# Patient Record
Sex: Male | Born: 1990 | Race: Black or African American | Hispanic: No | Marital: Single | Smoking: Current every day smoker
Health system: Southern US, Community
[De-identification: ages and names within clinical notes are randomized; demographics above are authoritative.]

## PROBLEM LIST (undated history)

## (undated) DIAGNOSIS — M419 Scoliosis, unspecified: Secondary | ICD-10-CM

---
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
## 2012-08-29 ENCOUNTER — Encounter (HOSPITAL_COMMUNITY): Payer: Self-pay | Admitting: Emergency Medicine

## 2012-08-29 ENCOUNTER — Emergency Department (HOSPITAL_COMMUNITY)
Admission: EM | Admit: 2012-08-29 | Discharge: 2012-08-29 | Disposition: A | Discharge status: Home / Self Care | Payer: Medicaid Other | Attending: Emergency Medicine | Admitting: Emergency Medicine

## 2012-08-29 DIAGNOSIS — R112 Nausea with vomiting, unspecified: Secondary | ICD-10-CM | POA: Insufficient documentation

## 2012-08-29 DIAGNOSIS — R197 Diarrhea, unspecified: Secondary | ICD-10-CM | POA: Insufficient documentation

## 2012-08-29 DIAGNOSIS — R110 Nausea: Secondary | ICD-10-CM

## 2012-08-29 DIAGNOSIS — R109 Unspecified abdominal pain: Secondary | ICD-10-CM

## 2012-08-29 LAB — POCT I-STAT, CHEM 8
HCT: 48 % (ref 39.0–52.0)
Hemoglobin: 16.3 g/dL (ref 13.0–17.0)
Potassium: 4 mEq/L (ref 3.5–5.1)
Sodium: 140 mEq/L (ref 135–145)
TCO2: 26 mmol/L (ref 0–100)

## 2012-08-29 LAB — CBC WITH DIFFERENTIAL/PLATELET
Basophils Absolute: 0 10*3/uL (ref 0.0–0.1)
Basophils Relative: 1 % (ref 0–1)
Eosinophils Absolute: 0.2 10*3/uL (ref 0.0–0.7)
MCH: 30.6 pg (ref 26.0–34.0)
MCHC: 34.6 g/dL (ref 30.0–36.0)
Neutrophils Relative %: 39 % — ABNORMAL LOW (ref 43–77)
Platelets: 187 10*3/uL (ref 150–400)
RBC: 4.93 MIL/uL (ref 4.22–5.81)
RDW: 12.1 % (ref 11.5–15.5)

## 2012-08-29 LAB — HEPATIC FUNCTION PANEL
Albumin: 3.9 g/dL (ref 3.5–5.2)
Indirect Bilirubin: 0.4 mg/dL (ref 0.3–0.9)
Total Protein: 6.9 g/dL (ref 6.0–8.3)

## 2012-08-29 MED ORDER — ONDANSETRON 4 MG PO TBDP: 4.0000 mg | ORAL_TABLET | Freq: Three times a day (TID) | ORAL | Status: DC | PRN

## 2012-08-29 MED ORDER — SODIUM CHLORIDE 0.9 % IV BOLUS (SEPSIS)
1000.0000 mL | Freq: Once | INTRAVENOUS | Status: AC
  Administered 2012-08-29: 1000 mL via INTRAVENOUS

## 2012-08-29 MED ORDER — ONDANSETRON HCL 4 MG/2ML IJ SOLN
4.0000 mg | Freq: Once | INTRAMUSCULAR | Status: AC
  Administered 2012-08-29: 4 mg via INTRAVENOUS
  Filled 2012-08-29: qty 2

## 2012-08-29 NOTE — ED Notes (Signed)
Pt given water and crackers, reports no nausea or vomiting.

## 2012-08-29 NOTE — ED Notes (Signed)
Pt c/o mid abdominal pain with n/v/d x 3-4 days. Pt reports symptoms began with abdominal pain with gas 3-4 days ago, then had diarrhea yesterday and vomiting today.

## 2012-08-29 NOTE — ED Provider Notes (Signed)
History     CSN: [id number]  Arrival date & time 08/29/12  2841   First MD Initiated Contact with Patient 08/29/12 1142      Chief Complaint  Patient presents with  . Abdominal Pain    (Consider location/radiation/quality/duration/timing/severity/associated sxs/prior treatment) HPI Comments: Pt presents to the ED for abdominal pain x 3 days.  Describes pain it as peri-umbilical, non-radiating, cramping sensation.  Episode of diarrhea last night and N/V this morning while at work.  Last normal BM 3 days ago.  Has been around family members with similar sx.  Has been passing foul smelling gas regularly.  Dietary changes noted- has been eating more fast food lately due to work schedule.  No significant PMH.  No recent EtOH.  Denies any CP, SOB, dysuria, fever, chills.  Patient is a 22 y.o. male presenting with abdominal pain. The history is provided by the patient.  Abdominal Pain   History reviewed. No pertinent past medical history.  History reviewed. No pertinent past surgical history.  No family history on file.  History  Substance Use Topics  . Smoking status: Never Smoker   . Smokeless tobacco: Not on file  . Alcohol Use: No      Review of Systems  Gastrointestinal: Positive for abdominal pain.  All other systems reviewed and are negative.    Allergies  Review of patient's allergies indicates no known allergies.  Home Medications  No current outpatient prescriptions on file.  BP 129/85  Pulse 79  Temp(Src) 98.6 F (37 C) (Oral)  Resp 19  SpO2 99%  Physical Exam  Nursing note and vitals reviewed. Constitutional: He is oriented to person, place, and time. Vital signs are normal. He appears well-developed and well-nourished.  HENT:  Head: Normocephalic and atraumatic.  Mouth/Throat: Oropharynx is clear and moist and mucous membranes are normal. No oropharyngeal exudate, posterior oropharyngeal erythema or tonsillar abscesses.  Eyes: Conjunctivae and EOM are  normal. Pupils are equal, round, and reactive to light.  Neck: Normal range of motion.  Cardiovascular: Normal rate, regular rhythm and normal heart sounds.   Pulmonary/Chest: Effort normal and breath sounds normal.  Abdominal: Soft. Bowel sounds are normal. He exhibits no distension. There is no tenderness. There is no CVA tenderness, no tenderness at McBurney's point and negative Murphy's sign.  Discomfort in periumbilical and epigastric regions but no true tenderness  Musculoskeletal: Normal range of motion.  Neurological: He is alert and oriented to person, place, and time. He has normal strength.  CN grossly intact  Skin: Skin is warm and dry.  Psychiatric: He has a normal mood and affect.    ED Course  Procedures (including critical care time)  Labs Reviewed  CBC WITH DIFFERENTIAL - Abnormal; Notable for the following:    WBC 3.1 (*)    Neutrophils Relative 39 (*)    Neutro Abs 1.2 (*)    Monocytes Relative 15 (*)    All other components within normal limits  LIPASE, BLOOD  HEPATIC FUNCTION PANEL  POCT I-STAT, CHEM 8   No results found.   1. Abdominal pain   2. Nausea       MDM    22 y.o. Male presenting to the ED with periumbilical abdominal pain x 3 days.  One episode of diarrhea yesterday and few episodes of vomiting this morning.  Labs largely unremarkable with the exception of above.  No active vomiting while in the ED.  Symptoms improved greatly with IV fluids and zofran.  Low suspicion  for acute pathology.  Able to tolerate PO food and drink prior to d/c.  Rx zofran given for at home use.  Return to the ED for new or worsening symptoms.      Garlon Hatchet, PA-C 08/29/12 1442

## 2012-09-01 NOTE — ED Provider Notes (Signed)
Medical screening examination/treatment/procedure(s) were performed by non-physician practitioner and as supervising physician I was immediately available for consultation/collaboration.   Laray Anger, DO 09/01/12 1836

## 2012-09-30 ENCOUNTER — Encounter (HOSPITAL_COMMUNITY): Payer: Self-pay

## 2012-09-30 ENCOUNTER — Emergency Department (INDEPENDENT_AMBULATORY_CARE_PROVIDER_SITE_OTHER)
Admission: EM | Admit: 2012-09-30 | Discharge: 2012-09-30 | Disposition: A | Discharge status: Home / Self Care | Payer: Medicaid Other | Source: Home / Self Care | Attending: Emergency Medicine | Admitting: Emergency Medicine

## 2012-09-30 DIAGNOSIS — B001 Herpesviral vesicular dermatitis: Secondary | ICD-10-CM

## 2012-09-30 DIAGNOSIS — B009 Herpesviral infection, unspecified: Secondary | ICD-10-CM

## 2012-09-30 MED ORDER — ACYCLOVIR 400 MG PO TABS: 400.0000 mg | ORAL_TABLET | Freq: Three times a day (TID) | ORAL | Status: AC

## 2012-09-30 NOTE — ED Provider Notes (Signed)
History     CSN: [id number]  Arrival date & time 09/30/12  1113   First MD Initiated Contact with Patient 09/30/12 1128      Chief Complaint  Patient presents with  . Oral Swelling    (Consider location/radiation/quality/duration/timing/severity/associated sxs/prior treatment) HPI Comments: Patient presents urgent care this afternoon describing for the last 2 days he has developed some sores on his upper lip that are tender and at times makes him experience a burning sensation. He has not had the sores before denies any further symptoms such as fevers, malaise, headaches, or palpable lymph nodes. Patient denies having had a history of herpes of the Allis or any other STDs.  The history is provided by the patient.    History reviewed. No pertinent past medical history.  History reviewed. No pertinent past surgical history.  History reviewed. No pertinent family history.  History  Substance Use Topics  . Smoking status: Never Smoker   . Smokeless tobacco: Not on file  . Alcohol Use: No      Review of Systems  Constitutional: Negative for fever, chills, activity change and appetite change.  Skin: Positive for rash.  Neurological: Negative for dizziness, weakness, numbness and headaches.    Allergies  Review of patient's allergies indicates no known allergies.  Home Medications   Current Outpatient Rx  Name  Route  Sig  Dispense  Refill  . acyclovir (ZOVIRAX) 400 MG tablet   Oral   Take 1 tablet (400 mg total) by mouth 3 (three) times daily.   15 tablet   1   . ondansetron (ZOFRAN ODT) 4 MG disintegrating tablet   Oral   Take 1 tablet (4 mg total) by mouth every 8 (eight) hours as needed for nausea.   10 tablet   0     BP 125/77  Pulse 78  Temp(Src) 98.2 F (36.8 C) (Oral)  Resp 16  SpO2 96%  Physical Exam  Vitals reviewed. Constitutional: He is oriented to person, place, and time. Vital signs are normal. He appears well-developed and well-nourished.   Non-toxic appearance. He does not have a sickly appearance. He does not appear ill. No distress.  HENT:  Mouth/Throat:    Neurological: He is alert and oriented to person, place, and time.  Skin: No rash noted. There is erythema.    ED Course  Procedures (including critical care time)  Labs Reviewed - No data to display No results found.   1. 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 Patient has been prescribed a sickly her and provide her with a refill for a future recurrence he.        Jimmie Molly, MD 09/30/12 1245

## 2012-09-30 NOTE — ED Notes (Signed)
Lips swollen for 2 days

## 2013-05-25 ENCOUNTER — Encounter (HOSPITAL_COMMUNITY): Payer: Self-pay | Admitting: Emergency Medicine

## 2013-05-25 ENCOUNTER — Emergency Department (HOSPITAL_COMMUNITY)
Admission: EM | Admit: 2013-05-25 | Discharge: 2013-05-25 | Disposition: A | Discharge status: Home / Self Care | Payer: Medicaid Other | Attending: Emergency Medicine | Admitting: Emergency Medicine

## 2013-05-25 DIAGNOSIS — R300 Dysuria: Secondary | ICD-10-CM | POA: Insufficient documentation

## 2013-05-25 DIAGNOSIS — R369 Urethral discharge, unspecified: Secondary | ICD-10-CM | POA: Insufficient documentation

## 2013-05-25 DIAGNOSIS — Z7721 Contact with and (suspected) exposure to potentially hazardous body fluids: Secondary | ICD-10-CM | POA: Insufficient documentation

## 2013-05-25 DIAGNOSIS — Z202 Contact with and (suspected) exposure to infections with a predominantly sexual mode of transmission: Secondary | ICD-10-CM

## 2013-05-25 LAB — URINE MICROSCOPIC-ADD ON

## 2013-05-25 LAB — GC/CHLAMYDIA PROBE AMP
CT Probe RNA: NEGATIVE
GC Probe RNA: NEGATIVE

## 2013-05-25 LAB — URINALYSIS, ROUTINE W REFLEX MICROSCOPIC
Bilirubin Urine: NEGATIVE
Nitrite: NEGATIVE
Protein, ur: NEGATIVE mg/dL
Specific Gravity, Urine: 1.024 (ref 1.005–1.030)
Urobilinogen, UA: 1 mg/dL (ref 0.0–1.0)

## 2013-05-25 MED ORDER — STERILE WATER FOR INJECTION IJ SOLN
INTRAMUSCULAR | Status: AC
  Administered 2013-05-25: 2.1 mL
  Filled 2013-05-25: qty 10

## 2013-05-25 MED ORDER — CEFTRIAXONE SODIUM 1 G IJ SOLR
1.0000 g | Freq: Once | INTRAMUSCULAR | Status: AC
  Administered 2013-05-25: 1 g via INTRAMUSCULAR
  Filled 2013-05-25: qty 10

## 2013-05-25 MED ORDER — AZITHROMYCIN 250 MG PO TABS
500.0000 mg | ORAL_TABLET | Freq: Once | ORAL | Status: AC
  Administered 2013-05-25: 500 mg via ORAL
  Filled 2013-05-25: qty 2

## 2013-05-25 NOTE — ED Provider Notes (Signed)
CSN: [id number]     Arrival date & time 05/25/13  0047 History   First MD Initiated Contact with Patient 05/25/13 0057     Chief Complaint  Patient presents with  . SEXUALLY TRANSMITTED DISEASE   (Consider location/radiation/quality/duration/timing/severity/associated sxs/prior Treatment) HPI Comments: Patient states that he had oral sex preformed on him last week and noticed dysuria 2 days later and discharge today   The history is provided by the patient.    History reviewed. No pertinent past medical history. History reviewed. No pertinent past surgical history. History reviewed. No pertinent family history. History  Substance Use Topics  . Smoking status: Never Smoker   . Smokeless tobacco: Not on file  . Alcohol Use: No    Review of Systems  Constitutional: Negative for fever and chills.  Gastrointestinal: Negative for abdominal pain.  Genitourinary: Positive for dysuria and discharge. Negative for frequency, penile swelling, penile pain and testicular pain.  All other systems reviewed and are negative.    Allergies  Review of patient's allergies indicates no known allergies.  Home Medications   Current Outpatient Rx  Name  Route  Sig  Dispense  Refill  . ondansetron (ZOFRAN ODT) 4 MG disintegrating tablet   Oral   Take 1 tablet (4 mg total) by mouth every 8 (eight) hours as needed for nausea.   10 tablet   0    BP 118/64  Pulse 90  Temp(Src) 98.3 F (36.8 C) (Oral)  Resp 16  Ht 5\' 7"  (1.702 m)  Wt 112 lb 9 oz (51.058 kg)  BMI 17.63 kg/m2  SpO2 100% Physical Exam  Nursing note and vitals reviewed. Constitutional: He appears well-developed and well-nourished.  HENT:  Head: Normocephalic.  Eyes: Pupils are equal, round, and reactive to light.  Neck: Normal range of motion.  Cardiovascular: Normal rate and regular rhythm.   Pulmonary/Chest: Effort normal.  Abdominal: Soft. He exhibits no distension. There is no tenderness.  Genitourinary: No penile  tenderness. Discharge found.  Musculoskeletal: Normal range of motion.  Neurological: He is alert.  Skin: Skin is warm. No rash noted. No erythema.    ED Course  Procedures (including critical care time) Labs Review Labs Reviewed  URINALYSIS, ROUTINE W REFLEX MICROSCOPIC - Abnormal; Notable for the following:    APPearance CLOUDY (*)    Leukocytes, UA MODERATE (*)    All other components within normal limits  URINE MICROSCOPIC-ADD ON - Abnormal; Notable for the following:    Bacteria, UA FEW (*)    All other components within normal limits  GC/CHLAMYDIA PROBE AMP  URINE CULTURE   Imaging Review No results found.  EKG Interpretation   None       MDM   1. Penile discharge   2. Exposure to STD         Arman Filter, NP 05/25/13 [phone]

## 2013-05-25 NOTE — ED Provider Notes (Signed)
Medical screening examination/treatment/procedure(s) were performed by non-physician practitioner and as supervising physician I was immediately available for consultation/collaboration.  EKG Interpretation   None        Shlomie Romig K Raiza Kiesel-Rasch, MD 05/25/13 0256 

## 2013-05-25 NOTE — ED Notes (Signed)
Pt states that he had oral sex and then noticed pus and discharge from his penis over the weekend.

## 2013-05-26 LAB — URINE CULTURE: Culture: NO GROWTH

## 2014-10-22 ENCOUNTER — Emergency Department (HOSPITAL_COMMUNITY)
Admission: EM | Admit: 2014-10-22 | Discharge: 2014-10-22 | Disposition: A | Discharge status: Home / Self Care | Payer: Medicaid Other | Attending: Emergency Medicine | Admitting: Emergency Medicine

## 2014-10-22 ENCOUNTER — Encounter (HOSPITAL_COMMUNITY): Payer: Self-pay

## 2014-10-22 DIAGNOSIS — Z711 Person with feared health complaint in whom no diagnosis is made: Secondary | ICD-10-CM

## 2014-10-22 DIAGNOSIS — Z202 Contact with and (suspected) exposure to infections with a predominantly sexual mode of transmission: Secondary | ICD-10-CM | POA: Insufficient documentation

## 2014-10-22 MED ORDER — STERILE WATER FOR INJECTION IJ SOLN
INTRAMUSCULAR | Status: AC
  Administered 2014-10-22: 10 mL
  Filled 2014-10-22: qty 10

## 2014-10-22 MED ORDER — CEFTRIAXONE SODIUM 250 MG IJ SOLR
250.0000 mg | Freq: Once | INTRAMUSCULAR | Status: AC
  Administered 2014-10-22: 250 mg via INTRAMUSCULAR
  Filled 2014-10-22: qty 250

## 2014-10-22 MED ORDER — AZITHROMYCIN 1 G PO PACK
1.0000 g | PACK | Freq: Once | ORAL | Status: AC
  Administered 2014-10-22: 1 g via ORAL
  Filled 2014-10-22: qty 1

## 2014-10-22 NOTE — Discharge Instructions (Signed)
Sexually Transmitted Disease °A sexually transmitted disease (STD) is a disease or infection that may be passed (transmitted) from person to person, usually during sexual activity. This may happen by way of saliva, semen, blood, vaginal mucus, or urine. Common STDs include:  °· Gonorrhea.   °· Chlamydia.   °· Syphilis.   °· HIV and AIDS.   °· Genital herpes.   °· Hepatitis B and C.   °· Trichomonas.   °· Human papillomavirus (HPV).   °· Pubic lice.   °· Scabies. °· Mites. °· Bacterial vaginosis. °WHAT ARE CAUSES OF STDs? °An STD may be caused by bacteria, a virus, or parasites. STDs are often transmitted during sexual activity if one person is infected. However, they may also be transmitted through nonsexual means. STDs may be transmitted after:  °· Sexual intercourse with an infected person.   °· Sharing sex toys with an infected person.   °· Sharing needles with an infected person or using unclean piercing or tattoo needles. °· Having intimate contact with the genitals, mouth, or rectal areas of an infected person.   °· Exposure to infected fluids during birth. °WHAT ARE THE SIGNS AND SYMPTOMS OF STDs? °Different STDs have different symptoms. Some people may not have any symptoms. If symptoms are present, they may include:  °· Painful or bloody urination.   °· Pain in the pelvis, abdomen, vagina, anus, throat, or eyes.   °· A skin rash, itching, or irritation. °· Growths, ulcerations, blisters, or sores in the genital and anal areas. °· Abnormal vaginal discharge with or without bad odor.   °· Penile discharge in men.   °· Fever.   °· Pain or bleeding during sexual intercourse.   °· Swollen glands in the groin area.   °· Yellow skin and eyes (jaundice). This is seen with hepatitis.   °· Swollen testicles. °· Infertility. °· Sores and blisters in the mouth. °HOW ARE STDs DIAGNOSED? °To make a diagnosis, your health care provider may:  °· Take a medical history.   °· Perform a physical exam.   °· Take a sample of  any discharge to examine. °· Swab the throat, cervix, opening to the penis, rectum, or vagina for testing. °· Test a sample of your first morning urine.   °· Perform blood tests.   °· Perform a Pap test, if this applies.   °· Perform a colposcopy.   °· Perform a laparoscopy.   °HOW ARE STDs TREATED? ° Treatment depends on the STD. Some STDs may be treated but not cured.  °· Chlamydia, gonorrhea, trichomonas, and syphilis can be cured with antibiotic medicine.   °· Genital herpes, hepatitis, and HIV can be treated, but not cured, with prescribed medicines. The medicines lessen symptoms.   °· Genital warts from HPV can be treated with medicine or by freezing, burning (electrocautery), or surgery. Warts may come back.   °· HPV cannot be cured with medicine or surgery. However, abnormal areas may be removed from the cervix, vagina, or vulva.   °· If your diagnosis is confirmed, your recent sexual partners need treatment. This is true even if they are symptom-free or have a negative culture or evaluation. They should not have sex until their health care providers say it is okay. °HOW CAN I REDUCE MY RISK OF GETTING AN STD? °Take these steps to reduce your risk of getting an STD: °· Use latex condoms, dental dams, and water-soluble lubricants during sexual activity. Do not use petroleum jelly or oils. °· Avoid having multiple sex partners. °· Do not have sex with someone who has other sex partners. °· Do not have sex with anyone you do not know or who is at   high risk for an STD. °· Avoid risky sex practices that can break your skin. °· Do not have sex if you have open sores on your mouth or skin. °· Avoid drinking too much alcohol or taking illegal drugs. Alcohol and drugs can affect your judgment and put you in a vulnerable position. °· Avoid engaging in oral and anal sex acts. °· Get vaccinated for HPV and hepatitis. If you have not received these vaccines in the past, talk to your health care provider about whether one  or both might be right for you.   °· If you are at risk of being infected with HIV, it is recommended that you take a prescription medicine daily to prevent HIV infection. This is called pre-exposure prophylaxis (PrEP). You are considered at risk if: °¨ You are a man who has sex with other men (MSM). °¨ You are a heterosexual man or woman and are sexually active with more than one partner. °¨ You take drugs by injection. °¨ You are sexually active with a partner who has HIV. °· Talk with your health care provider about whether you are at high risk of being infected with HIV. If you choose to begin PrEP, you should first be tested for HIV. You should then be tested every 3 months for as long as you are taking PrEP.   °WHAT SHOULD I DO IF I THINK I HAVE AN STD? °· See your health care provider.   °· Tell your sexual partner(s). They should be tested and treated for any STDs. °· Do not have sex until your health care provider says it is okay.  °WHEN SHOULD I GET IMMEDIATE MEDICAL CARE? °Contact your health care provider right away if:  °· You have severe abdominal pain. °· You are a man and notice swelling or pain in your testicles. °· You are a woman and notice swelling or pain in your vagina. °Document Released: 09/06/2002 Document Revised: 06/21/2013 Document Reviewed: 01/04/2013 °ExitCare® Patient Information ©2015 [employer]. This information is not intended to replace advice given to you by your health care provider. Make sure you discuss any questions you have with your health care provider. ° ° °Emergency Department Resource Guide °1) Find a Doctor and Pay Out of Pocket °Although you won't have to find out who is covered by your insurance plan, it is a good idea to ask around and get recommendations. You will then need to call the office and see if the doctor you have chosen will accept you as a new patient and what types of options they offer for patients who are self-pay. Some doctors offer discounts or  will set up payment plans for their patients who do not have insurance, but you will need to ask so you aren't surprised when you get to your appointment. ° °2) Contact Your Local Health Department °Not all health departments have doctors that can see patients for sick visits, but many do, so it is worth a call to see if yours does. If you don't know where your local health department is, you can check in your phone book. The CDC also has a tool to help you locate your state's health department, and many state websites also have listings of all of their local health departments. ° °3) Find a Walk-in Clinic °If your illness is not likely to be very severe or complicated, you may want to try a walk in clinic. These are popping up all over the country in pharmacies, drugstores, and shopping centers. They're usually   staffed by nurse practitioners or physician assistants that have been trained to treat common illnesses and complaints. They're usually fairly quick and inexpensive. However, if you have serious medical issues or chronic medical problems, these are probably not your best option. ° °No Primary Care Doctor: °- Call [facility] at  832-8000 - they can help you locate a primary care doctor that  accepts your insurance, provides certain services, etc. °- Physician Referral Service- 1-800-533-3463 ° °Chronic Pain Problems: °Organization         Address  Phone   Notes  ° [facility]  (336) 297-2271 Patients need to be referred by their primary care doctor.  ° °Medication Assistance: °Organization         Address  Phone   Notes  °[place] [facility] [address] °[place], [place] [address] (336) 641-8030 --Must be a resident of [place] °-- Must have NO insurance coverage whatsoever (no Medicaid/ Medicare, etc.) °-- The pt. MUST have a primary care doctor that directs their care regularly and follows them in the community °  °[facility]  (866)  331-1348   °[employer]  (888) 892-1162   ° °Agencies that provide inexpensive medical care: °Organization         Address  Phone   Notes  °[facility]  (336) 832-8035   °[facility]    (336) 832-7272   °[facility] [address] °[place], [place] [address] (336) 832-4777   °[facility] [address], °[place] (336) 271-4999   °[facility]    (336) 373-0678   °[facility]    (336) 272-1050   °[facility] ° [address], [place] Phone:  (336) 832-4444, Fax:  (336) 832-4440 Hours of Operation:  9 am - 6 pm, M-F.  Also accepts Medicaid/Medicare and self-pay.  °[facility] ° [address], [place] Phone: (336) 832-3150, Fax: (336) 832-3151. Hours of Operation:  8:30 am - 5:30 pm, M-F.  Also accepts Medicaid and self-pay.  °[facility] [place] [address], [place] Phone: (336) 878-6027   °[facility] [address], [place], [place] (336)723-1848, Ext. 123 Mondays & Thursdays: 7-9 AM.  First 15 patients are seen on a first come, first serve basis. °  ° °Medicaid-accepting [place] Providers: ° °Organization         Address  Phone   Notes  °[facility] [address], [place] (336) 641-2100 Also accepts self-pay patients.  °[facility] [address], [place] ° (336) 856-9996   °[facility] [address], [place] (336) 288-8857   °[facility] [address] [place] [address], [place] (336) 299-7000   °Veita Bland [address], [place]  ° (336) 373-1557 Only accepts [place] Access Medicaid patients after they have their name applied to their card.  ° °Self-Pay (no insurance) in [place]: ° °Organization         Address  Phone   Notes  °Sickle Cell Patients, [facility] [address], [place] (336) 832-1970   °[facility] [address], [place] (336) 832-4400   °[facility] ° [address], [place] (336) 992-4800   °[facility]/Dr. Osei-Bonsu ° [address] [place] [address], [place] or [address], [place] (336) 841-8500 Phone number for both [place] and [place] locations is the same.  °[facility] [address], [place] (336) 299-0000   °[facility] [address] [place] [address], [place] or [address] (336) 852-7530 °(336) 878-2260   °[facility] [address], [place] (336) 350-1642, phone; (336) 294-5005, fax Sees patients 1st and 3rd Saturday of every month.  Must not qualify for public or private insurance (i.e. Medicaid, Medicare, [place] Health Choice, Veterans' Benefits) • Household income should be no more than 200% of the poverty level •The clinic cannot treat you if you are pregnant or think you are pregnant • Sexually transmitted diseases are not treated at the clinic.  ° ° °Dental Care: °Organization         Address  Phone  Notes  °[facility] [address], [place] (336) 641-6152 Accepts children up to age 21 who are enrolled in Medicaid or [place] Health Choice; pregnant women with a Medicaid card; and children who have applied for Medicaid or [place] Health Choice, but were declined, whose parents can pay a reduced fee at time of service.  °[facility] [place]  [address], [place] (336) 641-7733 Accepts children up to age 21 who are enrolled in Medicaid or [place] Health Choice; pregnant women with a Medicaid card; and children who have applied for Medicaid or [place] Health Choice, but were declined, whose parents can pay a reduced fee at time of service.  °[facility] ° [address], [place] (336)  641-4533 Patients are seen by appointment only. Walk-ins are not accepted. [facility] will see patients 18 years of age and older. °Monday - Tuesday (8am-5pm) °Most Wednesdays (8:30-5pm) °$30 per visit, cash only  °[facility] ° [address], [place] (336) 641-4533 Patients are seen by appointment only. Walk-ins are not accepted. [facility] will see patients 18 years of age and older. °One Wednesday Evening (Monthly: Volunteer Based).  $30 per visit, cash only  °[facility]  (919) 537-3737 for adults; Children under age 4, call Graduate Pediatric Dentistry at (919) [phone]. Children aged 4-14, please call (919) [phone] to request a pediatric application. ° Dental services are provided in all areas of dental care including fillings, crowns and bridges, complete and partial dentures, implants, gum treatment, root canals, and extractions. Preventive care is also provided. Treatment is provided to both adults and children. °Patients are selected via a lottery and there is often a waiting list. °  °[facility] [address], ° ° (336) 763-8833 [email or web address] °  °[facility] [address], [place], [place] (336)723-1848, Ext. 123 Second and Fourth Thursday of each month, opens at 6:30 AM; Clinic ends at 9 AM.  Patients are seen on a first-come first-served basis, and a limited number are seen during each clinic.  ° °[facility] ° [address], [place],  (336) 723-7904   Eligibility Requirements °You must have lived in [place], [place], or [place] counties for at least the last three months. °  You cannot be eligible for state or federal sponsored healthcare insurance, including Veterans Administration, Medicaid, or Medicare. °  You generally cannot be eligible for healthcare insurance through your employer.  °    How to apply: °Eligibility screenings are held every Tuesday and Wednesday afternoon  from 1:00 pm until 4:00 pm. You do not need an appointment for the interview!  °[facility] [address], [place], [place] 336-631-2330   °[facility]  336-342-8273   °[facility]  336-703-3100   °[facility]  336-570-6415   ° °Behavioral Health Resources in the Community: °Intensive Outpatient Programs °Organization         Address  Phone  Notes  °[facility] [address], [place], [place] 336-878-6098   °[facility] [address], [place], [place] 336-832-9800   °[facility] [address], [place], [place] ° 336-882-2125   °[facility] [address],  °[place], [place] 1-800-853-5163 or 336-641-4981   °Substance Abuse Resources °Organization         Address  Phone  Notes  °[facility]  336-882-2125   °[facility]  336-784-9470   °[facility]  336-285-9073   °[facility]  336-845-3988   °Residential & Outpatient Substance Abuse Program  1-800-659-3381   °Psychological Services °Organization         Address  Phone  Notes  °[facility]  336- 832-9600   °[facility]  336- 378-7881   °[facility] [address], [place] 1-800-853-5163 or 336-641-4981   ° °Mobile Crisis Teams °Organization         Address  Phone  Notes  °[facility], Mobile Crisis Care Unit  1-877-626-1772   °Assertive °[facility] ° [address] [place], [place] 336-834-9664   °Sharon DeEsch [address] °[place] 336-554-5454   ° °Self-Help/Support Groups °Organization         Address  Phone             Notes  °[facility] [place] - variety of support groups  336- 373-1402 Call for more information  °Narcotics Anonymous (NA), [facility] [address], °[place]  2 meetings at this location  ° °Residential Treatment  Programs °Organization         Address  Phone  Notes  °[facility] [address],    °[place]  1-866-801-8205   °[facility] ° [address], [place], [place] 704-293-8524   °[facility] [address], [place] 336-845-3988 Admissions: 8am-3pm M-F  °[facility] [address]    °[place], [place] 336-841-1104   °[facility] [address], [place], [place] 336-379-7146   °[facility] [address]  °[place], [place] 336-285-9073   °[facility] Intensive Outpatient [address], [place], [place] 336-852-3033   °[facility] [address]  °[place], [place] 1-877-615-2722 or 336-784-9470   °[facility] [address] [place], [place] 336-227-7417 Accepts Medicaid  °[facility] [address]  ° [place] 1-800-659-3381 Substance Abuse/Addiction Treatment  ° °[place] Behavioral Health Resources °Organization         Address  Phone  Notes  °[facility]  (888) 581-9988   °Julie Brannon, PhD [address] [place], [place]   (336) 349-5553 or (336) 951-0000   °[facility]   [address] °[place], [place] (336) 349-4454   °[facility] [address], [place], [place] (336) 342-8316 Insurance/Medicaid/sponsorship through [facility]  °[facility] [address]                                    [place], [place] (336) 342-8316 Therapy/tele-psych/case  °[facility] [address]  ° [place], [place] (336) 349-2233    °Dr. Arfeen  (336) 349-4544   °[facility] [place]  [employer] [place] [facility] 1) [address], [place] °2) [address], [place] °3)  [address], [place] (336) 349-3220 °(336) 342-7768 ° °(336) 342-8140   °[place] [facility] (336) 342-1394 or (336) 342-3537 (After Hours)    ° ° ° °

## 2014-10-22 NOTE — ED Notes (Signed)
Pt here to be tested for STD/HIV.  Pt heard partner was HIV +.  Last intercourse with this person in Feb, 2016.  Pt does use condoms.

## 2014-10-22 NOTE — ED Provider Notes (Signed)
CSN: [id number]     Arrival date & time 10/22/14  1111 History   None    Chief Complaint  Patient presents with  . Exposure to STD   The history is provided by the patient. No language interpreter was used.   This chart was scribed for non-physician practitioner Ladona MowJoe Shean Gerding, PA-C, working with No att. providers found, by Andrew Auaven Small, ED Scribe. This patient was seen in room TR10C/TR10C and the patient's care was started at 12:42 PM.  Earl Kim is a 24 y.o. male who presents to the Emergency Department complaining of exposure to STD. Pt states he heard that his old partner that he last had intercourse with 2 month ago is HIV+. Pt had some clear penile discharge once last week.  Pt denies dysuria, abdominal pain, testicular pain, nausea, and emesis.   History reviewed. No pertinent past medical history. History reviewed. No pertinent past surgical history. History reviewed. No pertinent family history. History  Substance Use Topics  . Smoking status: Never Smoker   . Smokeless tobacco: Not on file  . Alcohol Use: 1.2 oz/week    2 Shots of liquor per week     Comment: on weekends    Review of Systems  Gastrointestinal: Negative for nausea, vomiting and abdominal pain.  Genitourinary: Positive for discharge. Negative for dysuria and testicular pain.   Allergies  Review of patient's allergies indicates no known allergies.  Home Medications   Prior to Admission medications   Medication Sig Start Date End Date Taking? Authorizing Provider  ondansetron (ZOFRAN ODT) 4 MG disintegrating tablet Take 1 tablet (4 mg total) by mouth every 8 (eight) hours as needed for nausea. 08/29/12   Garlon HatchetLisa M Sanders, PA-C   BP 147/80 mmHg  Pulse 100  Temp(Src) 98.1 F (36.7 C) (Oral)  Resp 16  Ht 5\' 7"  (1.702 m)  Wt 117 lb 6.4 oz (53.252 kg)  BMI 18.38 kg/m2  SpO2 100% Physical Exam  Constitutional: He is oriented to person, place, and time. He appears well-developed and well-nourished. No  distress.  HENT:  Head: Normocephalic and atraumatic.  Eyes: Conjunctivae and EOM are normal.  Neck: Neck supple.  Cardiovascular: Normal rate.   Pulmonary/Chest: Effort normal.  Abdominal: Hernia confirmed negative in the right inguinal area and confirmed negative in the left inguinal area.  Genitourinary: Testes normal and penis normal. Cremasteric reflex is present. Right testis shows no mass, no swelling and no tenderness. Right testis is descended. Cremasteric reflex is not absent on the right side. Left testis shows no mass, no swelling and no tenderness. Left testis is descended. Cremasteric reflex is not absent on the left side. Circumcised. No phimosis, paraphimosis, hypospadias, penile erythema or penile tenderness. No discharge found.  No penile discharge noted on exam. No lesions or rash noted. No testicular pain or tenderness or swelling. Chaperone present during entire genitourinary exam.  Musculoskeletal: Normal range of motion.  Neurological: He is alert and oriented to person, place, and time.  Skin: Skin is warm and dry.  Psychiatric: He has a normal mood and affect. His behavior is normal.  Nursing note and vitals reviewed.   ED Course  Procedures (including critical care time) DIAGNOSTIC STUDIES: Oxygen Saturation is 100% on RA, normal by my interpretation.    COORDINATION OF CARE: 12:42 PM- Pt advised of plan for treatment and pt agrees.  Labs Review Labs Reviewed  RPR  HIV ANTIBODY (ROUTINE TESTING)  GC/CHLAMYDIA PROBE AMP ()    Imaging Review  No results found.   EKG Interpretation None      MDM   Final diagnoses:  Concern about STD in male without diagnosis    Patient here for evaluation of possible STD exposure. Patient is unsure of whether or not the source of his concern was positive, however he states he only heard rumors and wishes to be tested for it STDs. GC chlamydia sent. Patient describing some penile discharge last week, will  treat for GC/chlamydia azithromycin and Rocephin. RPR and HIV sent. Patient encouraged to follow-up at the health department. Return precautions discussed, patient verbalizes understanding and agreement this plan. I encouraged patient to call or return to the ER should he have any questions or concerns.  I personally performed the services described in this documentation, which was scribed in my presence. The recorded information has been reviewed and is accurate.  BP 147/80 mmHg  Pulse 100  Temp(Src) 98.1 F (36.7 C) (Oral)  Resp 16  Ht  (1.702 m)  Wt 117 lb 6.4 oz (53.252 kg)  BMI 18.38 kg/m2  SpO2 100%  Signed,  Ladona Mow, PA-C 12:42 PM     Ladona Mow, PA-C 10/22/14 1242  Arby Barrette, MD 10/28/14 [id number]

## 2014-10-22 NOTE — ED Notes (Signed)
Declined W/C at D/C and was escorted to lobby by RN. 

## 2014-10-23 LAB — HIV ANTIBODY (ROUTINE TESTING W REFLEX): HIV Screen 4th Generation wRfx: NONREACTIVE

## 2014-10-23 LAB — RPR: RPR: NONREACTIVE

## 2014-10-23 LAB — GC/CHLAMYDIA PROBE AMP (~~LOC~~) NOT AT ARMC
Chlamydia: NEGATIVE
Neisseria Gonorrhea: NEGATIVE

## 2015-01-30 ENCOUNTER — Encounter (HOSPITAL_COMMUNITY): Payer: Self-pay | Admitting: Emergency Medicine

## 2015-01-30 ENCOUNTER — Emergency Department (HOSPITAL_COMMUNITY)
Admission: EM | Admit: 2015-01-30 | Discharge: 2015-01-30 | Disposition: A | Discharge status: Home / Self Care | Payer: Medicaid Other | Attending: Emergency Medicine | Admitting: Emergency Medicine

## 2015-01-30 DIAGNOSIS — R197 Diarrhea, unspecified: Secondary | ICD-10-CM

## 2015-01-30 DIAGNOSIS — R1012 Left upper quadrant pain: Secondary | ICD-10-CM | POA: Insufficient documentation

## 2015-01-30 DIAGNOSIS — R110 Nausea: Secondary | ICD-10-CM | POA: Insufficient documentation

## 2015-01-30 LAB — COMPREHENSIVE METABOLIC PANEL
ALT: 27 U/L (ref 17–63)
AST: 28 U/L (ref 15–41)
Albumin: 4 g/dL (ref 3.5–5.0)
Alkaline Phosphatase: 84 U/L (ref 38–126)
Anion gap: 7 (ref 5–15)
BILIRUBIN TOTAL: 0.5 mg/dL (ref 0.3–1.2)
BUN: 13 mg/dL (ref 6–20)
CO2: 26 mmol/L (ref 22–32)
Calcium: 8.9 mg/dL (ref 8.9–10.3)
Chloride: 104 mmol/L (ref 101–111)
Creatinine, Ser: 1.09 mg/dL (ref 0.61–1.24)
GFR calc Af Amer: >60 mL/min (ref >60)
GFR calc non Af Amer: >60 mL/min (ref >60)
Glucose, Bld: 115 mg/dL — ABNORMAL HIGH (ref 65–99)
Potassium: 3.7 mmol/L (ref 3.5–5.1)
SODIUM: 137 mmol/L (ref 135–145)
TOTAL PROTEIN: 6.9 g/dL (ref 6.5–8.1)

## 2015-01-30 LAB — URINALYSIS, ROUTINE W REFLEX MICROSCOPIC
BILIRUBIN URINE: NEGATIVE
Glucose, UA: NEGATIVE mg/dL
HGB URINE DIPSTICK: NEGATIVE
KETONES UR: NEGATIVE mg/dL
Leukocytes, UA: NEGATIVE
NITRITE: NEGATIVE
PH: 6.5 (ref 5.0–8.0)
Protein, ur: NEGATIVE mg/dL
SPECIFIC GRAVITY, URINE: 1.03 (ref 1.005–1.030)
Urobilinogen, UA: 0.2 mg/dL (ref 0.0–1.0)

## 2015-01-30 LAB — CBC
HCT: 47.3 % (ref 39.0–52.0)
HEMOGLOBIN: 16.9 g/dL (ref 13.0–17.0)
MCH: 32.3 pg (ref 26.0–34.0)
MCHC: 35.7 g/dL (ref 30.0–36.0)
MCV: 90.4 fL (ref 78.0–100.0)
PLATELETS: 196 10*3/uL (ref 150–400)
RBC: 5.23 MIL/uL (ref 4.22–5.81)
RDW: 12.1 % (ref 11.5–15.5)
WBC: 4.8 10*3/uL (ref 4.0–10.5)

## 2015-01-30 LAB — LIPASE, BLOOD: Lipase: 26 U/L (ref 22–51)

## 2015-01-30 NOTE — ED Provider Notes (Signed)
CSN: [id number]     Arrival date & time 01/30/15  2021 History   First MD Initiated Contact with Patient 01/30/15 2208     Chief Complaint  Patient presents with  . Abdominal Pain     (Consider location/radiation/quality/duration/timing/severity/associated sxs/prior Treatment) Patient is a 24 y.o. male presenting with abdominal pain.  Abdominal Pain Pain location:  LLQ and LUQ Pain quality: cramping   Pain radiates to:  Does not radiate Pain severity:  Moderate Onset quality:  Gradual Duration:  2 days Timing:  Constant Progression:  Partially resolved Chronicity:  New Context comment:  Sick contacts at work Relieved by:  Nothing Worsened by:  Nothing tried Associated symptoms: diarrhea and nausea   Associated symptoms: no dysuria, no fever and no vomiting   Associated symptoms comment:  No penile discharge or testicular pain Diarrhea:    Quality:  Watery   Number of occurrences:  4 today   Progression:  Partially resolved (no diarrhea in about 7 hours)   History reviewed. No pertinent past medical history. History reviewed. No pertinent past surgical history. No family history on file. History  Substance Use Topics  . Smoking status: Never Smoker   . Smokeless tobacco: Not on file  . Alcohol Use: 1.2 oz/week    2 Shots of liquor per week     Comment: on weekends    Review of Systems  Constitutional: Negative for fever.  Gastrointestinal: Positive for nausea, abdominal pain and diarrhea. Negative for vomiting.  Genitourinary: Negative for dysuria.  All other systems reviewed and are negative.     Allergies  Review of patient's allergies indicates no known allergies.  Home Medications   Prior to Admission medications   Medication Sig Start Date End Date Taking? Authorizing Provider  ondansetron (ZOFRAN ODT) 4 MG disintegrating tablet Take 1 tablet (4 mg total) by mouth every 8 (eight) hours as needed for nausea. 08/29/12   Garlon Hatchet, PA-C   BP 135/71  mmHg  Pulse 71  Temp(Src) 98.1 F (36.7 C) (Oral)  Resp 18  Wt 118 lb (53.524 kg)  SpO2 100% Physical Exam  Constitutional: He is oriented to person, place, and time. He appears well-developed and well-nourished. No distress.  HENT:  Head: Normocephalic and atraumatic.  Eyes: Conjunctivae are normal. No scleral icterus.  Neck: Neck supple.  Cardiovascular: Normal rate and intact distal pulses.   Pulmonary/Chest: Effort normal. No stridor. No respiratory distress.  Abdominal: Soft. Normal appearance. He exhibits no distension. There is no tenderness. There is no rebound and no guarding.  Neurological: He is alert and oriented to person, place, and time.  Skin: Skin is warm and dry. No rash noted.  Psychiatric: He has a normal mood and affect. His behavior is normal.  Nursing note and vitals reviewed.   ED Course  Procedures (including critical care time) Labs Review Labs Reviewed  COMPREHENSIVE METABOLIC PANEL - Abnormal; Notable for the following:    Glucose, Bld 115 (*)    All other components within normal limits  LIPASE, BLOOD  CBC  URINALYSIS, ROUTINE W REFLEX MICROSCOPIC (NOT AT [facility])    Imaging Review No results found.   EKG Interpretation None      MDM   Final diagnoses:  Diarrhea    24 yo male with diarrhea for 2 days. Had some nausea at first, but now just diarrhea.  He is tolerating PO.  Diarrhea is improving.  Abdomen is soft and nontender.  Plan dc with oral hydration.  Blake Divine, MD 01/30/15 2326

## 2015-01-30 NOTE — ED Notes (Signed)
Pt. reports mid/left abdominal pain with nausea and diarrhea onset 3 days ago , denies fever or chills. No urinary discomfort.

## 2015-01-30 NOTE — ED Notes (Signed)
Patient is alert and orientedx4.  Patient was explained discharge instructions and they understood them with no questions.   

## 2015-01-30 NOTE — Discharge Instructions (Signed)

## 2015-05-31 ENCOUNTER — Emergency Department (INDEPENDENT_AMBULATORY_CARE_PROVIDER_SITE_OTHER)
Admission: EM | Admit: 2015-05-31 | Discharge: 2015-05-31 | Disposition: A | Discharge status: Home / Self Care | Payer: Self-pay | Source: Home / Self Care | Attending: Family Medicine | Admitting: Family Medicine

## 2015-05-31 ENCOUNTER — Encounter (HOSPITAL_COMMUNITY): Payer: Self-pay | Admitting: *Deleted

## 2015-05-31 DIAGNOSIS — J069 Acute upper respiratory infection, unspecified: Secondary | ICD-10-CM

## 2015-05-31 MED ORDER — IPRATROPIUM BROMIDE 0.06 % NA SOLN: 2.0000 | Freq: Four times a day (QID) | NASAL | Status: DC

## 2015-05-31 MED ORDER — AZITHROMYCIN 250 MG PO TABS: ORAL_TABLET | ORAL | Status: DC

## 2015-05-31 NOTE — ED Provider Notes (Signed)
CSN: [id number]     Arrival date & time 05/31/15  1705 History   First MD Initiated Contact with Patient 05/31/15 1810     Chief Complaint  Patient presents with  . Sore Throat   (Consider location/radiation/quality/duration/timing/severity/associated sxs/prior Treatment) Patient is a 24 y.o. male presenting with pharyngitis. The history is provided by the patient.  Sore Throat This is a new problem. The current episode started more than 1 week ago. The problem has been gradually worsening. The symptoms are aggravated by swallowing.    History reviewed. No pertinent past medical history. History reviewed. No pertinent past surgical history. History reviewed. No pertinent family history. Social History  Substance Use Topics  . Smoking status: Never Smoker   . Smokeless tobacco: None  . Alcohol Use: No     Comment: on weekends    Review of Systems  Constitutional: Negative.  Negative for fever, chills and appetite change.  HENT: Positive for congestion, postnasal drip, rhinorrhea and sore throat.   Respiratory: Negative.   Cardiovascular: Negative.   All other systems reviewed and are negative.   Allergies  Review of patient's allergies indicates no known allergies.  Home Medications   Prior to Admission medications   Medication Sig Start Date End Date Taking? Authorizing Provider  azithromycin (ZITHROMAX Z-PAK) 250 MG tablet Take as directed on pack 05/31/15   Linna HoffJames D Anjelique Makar, MD  ipratropium (ATROVENT) 0.06 % nasal spray Place 2 sprays into both nostrils 4 (four) times daily. 05/31/15   Linna HoffJames D Melynda Krzywicki, MD  ondansetron (ZOFRAN ODT) 4 MG disintegrating tablet Take 1 tablet (4 mg total) by mouth every 8 (eight) hours as needed for nausea. 08/29/12   Garlon HatchetLisa M Sanders, PA-C   Meds Ordered and Administered this Visit  Medications - No data to display  BP 104/72 mmHg  Pulse 82  Temp(Src) 98.6 F (37 C) (Oral)  Resp 18  SpO2 99% No data found.   Physical Exam  Constitutional:  He is oriented to person, place, and time. He appears well-developed and well-nourished.  HENT:  Head: Normocephalic.  Right Ear: External ear normal.  Left Ear: External ear normal.  Nose: Mucosal edema and rhinorrhea present.  Mouth/Throat: Oropharynx is clear and moist.  Neck: Normal range of motion. Neck supple.  Cardiovascular: Normal heart sounds and intact distal pulses.   Pulmonary/Chest: Effort normal and breath sounds normal.  Lymphadenopathy:    He has no cervical adenopathy.  Neurological: He is alert and oriented to person, place, and time.  Skin: Skin is warm and dry.  Nursing note and vitals reviewed.   ED Course  Procedures (including critical care time)  Labs Review Labs Reviewed - No data to display  Imaging Review No results found.   Visual Acuity Review  Right Eye Distance:   Left Eye Distance:   Bilateral Distance:    Right Eye Near:   Left Eye Near:    Bilateral Near:         MDM   1. URI (upper respiratory infection)        Linna HoffJames D Alessander Sikorski, MD 05/31/15 [phone]

## 2015-05-31 NOTE — ED Notes (Signed)
Pt  Reports  Symptoms  Of  sorethroat       With body  Aches    X  5  Days      pt  Reports  Has  Been  Weak  And  Had  A  Fever   At  Home  As   Well

## 2016-02-10 ENCOUNTER — Encounter (HOSPITAL_COMMUNITY): Payer: Self-pay

## 2016-02-10 ENCOUNTER — Emergency Department (HOSPITAL_COMMUNITY)
Admission: EM | Admit: 2016-02-10 | Discharge: 2016-02-10 | Disposition: A | Discharge status: Home / Self Care | Payer: Medicaid Other | Attending: Emergency Medicine | Admitting: Emergency Medicine

## 2016-02-10 DIAGNOSIS — Z79899 Other long term (current) drug therapy: Secondary | ICD-10-CM | POA: Insufficient documentation

## 2016-02-10 DIAGNOSIS — N4889 Other specified disorders of penis: Secondary | ICD-10-CM | POA: Insufficient documentation

## 2016-02-10 LAB — URINE MICROSCOPIC-ADD ON: RBC / HPF: NONE SEEN RBC/hpf (ref 0–5)

## 2016-02-10 LAB — URINALYSIS, ROUTINE W REFLEX MICROSCOPIC
BILIRUBIN URINE: NEGATIVE
GLUCOSE, UA: NEGATIVE mg/dL
Hgb urine dipstick: NEGATIVE
KETONES UR: NEGATIVE mg/dL
NITRITE: NEGATIVE
PH: 6.5 (ref 5.0–8.0)
Protein, ur: NEGATIVE mg/dL
Specific Gravity, Urine: 1.03 (ref 1.005–1.030)

## 2016-02-10 MED ORDER — AZITHROMYCIN 250 MG PO TABS
1000.0000 mg | ORAL_TABLET | Freq: Once | ORAL | Status: AC
  Administered 2016-02-10: 1000 mg via ORAL
  Filled 2016-02-10: qty 4

## 2016-02-10 MED ORDER — STERILE WATER FOR INJECTION IJ SOLN
INTRAMUSCULAR | Status: AC
  Administered 2016-02-10: 10 mL
  Filled 2016-02-10: qty 10

## 2016-02-10 MED ORDER — CEFTRIAXONE SODIUM 250 MG IJ SOLR
250.0000 mg | Freq: Once | INTRAMUSCULAR | Status: AC
  Administered 2016-02-10: 250 mg via INTRAMUSCULAR
  Filled 2016-02-10: qty 250

## 2016-02-10 NOTE — ED Triage Notes (Signed)
Patient here with 1 week of penile tingling and mild pain. Denies dysuria and denies penile discharge

## 2016-02-10 NOTE — ED Notes (Signed)
Declined W/C at D/C and was escorted to lobby by RN. 

## 2016-02-10 NOTE — ED Provider Notes (Signed)
[facility]-EMERGENCY DEPT Provider Note   CSN: [id number] Arrival date & time: 02/10/16  [id number]  First Provider Contact:  First MD Initiated Contact with Patient 02/10/16 0818        History   Chief Complaint Chief Complaint  Patient presents with  . Penis Pain    HPI Earl Kim is a 25 y.o. male.  HPI Patient presents to the emergency department with penile itching and irritation.  The patient states that he does have some discomfort after urinating.  Patient denies any penile discharge.  Patient states that he is sexually active and does not use any protection.  Patient did not take any medications prior to arrival.  He states he knows he symptoms 1 week ago History reviewed. No pertinent past medical history.  There are no active problems to display for this patient.   History reviewed. No pertinent surgical history.     Home Medications    Prior to Admission medications   Medication Sig Start Date End Date Taking? Authorizing Provider  azithromycin (ZITHROMAX Z-PAK) 250 MG tablet Take as directed on pack 05/31/15   Linna HoffJames D Kindl, MD  ipratropium (ATROVENT) 0.06 % nasal spray Place 2 sprays into both nostrils 4 (four) times daily. 05/31/15   Linna HoffJames D Kindl, MD  ondansetron (ZOFRAN ODT) 4 MG disintegrating tablet Take 1 tablet (4 mg total) by mouth every 8 (eight) hours as needed for nausea. 08/29/12   Garlon HatchetLisa M Sanders, PA-C    Family History No family history on file.  Social History Social History  Substance Use Topics  . Smoking status: Never Smoker  . Smokeless tobacco: Never Used  . Alcohol use No     Comment: on weekends     Allergies   Review of patient's allergies indicates no known allergies.   Review of Systems Review of Systems All other systems negative except as documented in the HPI. All pertinent positives and negatives as reviewed in the HPI.  Physical Exam Updated Vital Signs BP 130/94 (BP Location: Right Arm)   Pulse 75   Temp 97.8 F  (36.6 C) (Oral)   Resp (!) 104   SpO2 100%   Physical Exam  Constitutional: He is oriented to person, place, and time. He appears well-developed and well-nourished. No distress.  HENT:  Head: Normocephalic and atraumatic.  Cardiovascular: Normal rate.   Pulmonary/Chest: Effort normal.  Genitourinary: Penis normal.  Neurological: He is alert and oriented to person, place, and time.  Skin: Skin is warm and dry.  Nursing note and vitals reviewed.    ED Treatments / Results  Labs (all labs ordered are listed, but only abnormal results are displayed) Labs Reviewed  URINALYSIS, ROUTINE W REFLEX MICROSCOPIC (NOT AT [facility]) - Abnormal; Notable for the following:       Result Value   APPearance CLOUDY (*)    Leukocytes, UA MODERATE (*)    All other components within normal limits  URINE MICROSCOPIC-ADD ON - Abnormal; Notable for the following:    Squamous Epithelial / LPF 0-5 (*)    Bacteria, UA FEW (*)    All other components within normal limits  URINE CULTURE  GC/CHLAMYDIA PROBE AMP ([place]) NOT AT [facility]    EKG  EKG Interpretation None       Radiology No results found.  Procedures Procedures (including critical care time)  Medications Ordered in ED Medications  cefTRIAXone (ROCEPHIN) injection 250 mg (250 mg Intramuscular Given 02/10/16 0918)  azithromycin (ZITHROMAX) tablet 1,000 mg (1,000  mg Oral Given 02/10/16 0917)  sterile water (preservative free) injection (10 mLs  Given 02/10/16 0919)     Initial Impression / Assessment and Plan / ED Course  I have reviewed the triage vital signs and the nursing notes.  Pertinent labs & imaging results that were available during my care of the patient were reviewed by me and considered in my medical decision making (see chart for details).  Clinical Course    Patient be treated for STDs.  It did send off his urine for culture and for STD testing.  Patient is advised plan and all questions were answered Final Clinical  Impressions(s) / ED Diagnoses   Final diagnoses:  Penile irritation    New Prescriptions Discharge Medication List as of 02/10/2016  9:34 AM       Charlestine Night, PA-C 02/10/16 1517    Lorre Nick, MD 02/11/16 [phone]

## 2016-02-10 NOTE — Discharge Instructions (Signed)
Return here as needed.  Follow-up with the primary care doctor

## 2016-02-11 LAB — URINE CULTURE: Culture: NO GROWTH

## 2017-04-04 ENCOUNTER — Ambulatory Visit (HOSPITAL_COMMUNITY)
Admission: EM | Admit: 2017-04-04 | Discharge: 2017-04-04 | Disposition: A | Discharge status: Other Acute Inpatient Hospital | Payer: Self-pay

## 2017-05-16 ENCOUNTER — Emergency Department (HOSPITAL_COMMUNITY): Payer: No Typology Code available for payment source

## 2017-05-16 ENCOUNTER — Encounter (HOSPITAL_COMMUNITY): Payer: Self-pay | Admitting: Emergency Medicine

## 2017-05-16 ENCOUNTER — Emergency Department (HOSPITAL_COMMUNITY)
Admission: EM | Admit: 2017-05-16 | Discharge: 2017-05-16 | Disposition: A | Discharge status: Home / Self Care | Payer: No Typology Code available for payment source | Attending: Emergency Medicine | Admitting: Emergency Medicine

## 2017-05-16 ENCOUNTER — Other Ambulatory Visit: Payer: Self-pay

## 2017-05-16 DIAGNOSIS — S161XXA Strain of muscle, fascia and tendon at neck level, initial encounter: Secondary | ICD-10-CM | POA: Diagnosis not present

## 2017-05-16 DIAGNOSIS — Y9389 Activity, other specified: Secondary | ICD-10-CM | POA: Diagnosis not present

## 2017-05-16 DIAGNOSIS — Z791 Long term (current) use of non-steroidal anti-inflammatories (NSAID): Secondary | ICD-10-CM | POA: Diagnosis not present

## 2017-05-16 DIAGNOSIS — F17200 Nicotine dependence, unspecified, uncomplicated: Secondary | ICD-10-CM | POA: Insufficient documentation

## 2017-05-16 DIAGNOSIS — S199XXA Unspecified injury of neck, initial encounter: Secondary | ICD-10-CM | POA: Diagnosis present

## 2017-05-16 DIAGNOSIS — M545 Low back pain: Secondary | ICD-10-CM | POA: Insufficient documentation

## 2017-05-16 DIAGNOSIS — R51 Headache: Secondary | ICD-10-CM | POA: Diagnosis not present

## 2017-05-16 DIAGNOSIS — Y998 Other external cause status: Secondary | ICD-10-CM | POA: Diagnosis not present

## 2017-05-16 DIAGNOSIS — Y92410 Unspecified street and highway as the place of occurrence of the external cause: Secondary | ICD-10-CM | POA: Insufficient documentation

## 2017-05-16 DIAGNOSIS — T148XXA Other injury of unspecified body region, initial encounter: Secondary | ICD-10-CM

## 2017-05-16 MED ORDER — IBUPROFEN 200 MG PO TABS
600.0000 mg | ORAL_TABLET | Freq: Once | ORAL | Status: AC
  Administered 2017-05-16: 600 mg via ORAL
  Filled 2017-05-16: qty 3

## 2017-05-16 MED ORDER — CYCLOBENZAPRINE HCL 10 MG PO TABS
10.0000 mg | ORAL_TABLET | Freq: Once | ORAL | Status: AC
  Administered 2017-05-16: 10 mg via ORAL
  Filled 2017-05-16: qty 1

## 2017-05-16 MED ORDER — CYCLOBENZAPRINE HCL 10 MG PO TABS: 10.0000 mg | ORAL_TABLET | Freq: Two times a day (BID) | ORAL | 0 refills | Status: DC | PRN

## 2017-05-16 MED ORDER — DICLOFENAC SODIUM 50 MG PO TBEC: 50.0000 mg | DELAYED_RELEASE_TABLET | Freq: Two times a day (BID) | ORAL | 0 refills | Status: DC
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
## 2017-05-16 NOTE — Discharge Instructions (Signed)
Do not drive while taking the muscle relaxant as it will make you sleepy. Return as needed for worsening symptoms.

## 2017-05-16 NOTE — ED Notes (Signed)
Bed: WTR6 Expected date:  Expected time:  Means of arrival:  Comments: 
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
## 2017-08-30 ENCOUNTER — Emergency Department (HOSPITAL_COMMUNITY): Payer: Self-pay

## 2017-08-30 ENCOUNTER — Encounter (HOSPITAL_COMMUNITY): Payer: Self-pay | Admitting: Emergency Medicine

## 2017-08-30 ENCOUNTER — Emergency Department (HOSPITAL_COMMUNITY)
Admission: EM | Admit: 2017-08-30 | Discharge: 2017-08-30 | Disposition: A | Discharge status: Home / Self Care | Payer: Self-pay | Attending: Emergency Medicine | Admitting: Emergency Medicine

## 2017-08-30 ENCOUNTER — Other Ambulatory Visit: Payer: Self-pay

## 2017-08-30 DIAGNOSIS — J020 Streptococcal pharyngitis: Secondary | ICD-10-CM | POA: Insufficient documentation

## 2017-08-30 DIAGNOSIS — R509 Fever, unspecified: Secondary | ICD-10-CM | POA: Insufficient documentation

## 2017-08-30 DIAGNOSIS — R5081 Fever presenting with conditions classified elsewhere: Secondary | ICD-10-CM

## 2017-08-30 DIAGNOSIS — R6889 Other general symptoms and signs: Secondary | ICD-10-CM

## 2017-08-30 DIAGNOSIS — F17200 Nicotine dependence, unspecified, uncomplicated: Secondary | ICD-10-CM | POA: Insufficient documentation

## 2017-08-30 DIAGNOSIS — R634 Abnormal weight loss: Secondary | ICD-10-CM | POA: Insufficient documentation

## 2017-08-30 LAB — RAPID STREP SCREEN (MED CTR MEBANE ONLY): Streptococcus, Group A Screen (Direct): POSITIVE — AB

## 2017-08-30 MED ORDER — KETOROLAC TROMETHAMINE 30 MG/ML IJ SOLN
30.0000 mg | Freq: Once | INTRAMUSCULAR | Status: AC
  Administered 2017-08-30: 30 mg via INTRAMUSCULAR
  Filled 2017-08-30: qty 1

## 2017-08-30 MED ORDER — PENICILLIN V POTASSIUM 500 MG PO TABS: 500.0000 mg | ORAL_TABLET | Freq: Two times a day (BID) | ORAL | 0 refills | Status: AC

## 2017-08-30 MED ORDER — DEXAMETHASONE SODIUM PHOSPHATE 10 MG/ML IJ SOLN
10.0000 mg | Freq: Once | INTRAMUSCULAR | Status: AC
  Administered 2017-08-30: 10 mg via INTRAMUSCULAR
  Filled 2017-08-30: qty 1

## 2017-08-30 MED ORDER — PENICILLIN V POTASSIUM 500 MG PO TABS
500.0000 mg | ORAL_TABLET | Freq: Once | ORAL | Status: AC
  Administered 2017-08-30: 500 mg via ORAL
  Filled 2017-08-30: qty 1

## 2017-08-30 MED ORDER — SODIUM CHLORIDE 0.9 % IV BOLUS (SEPSIS): 1000.0000 mL | Freq: Once | INTRAVENOUS | Status: DC

## 2017-08-30 MED ORDER — ACETAMINOPHEN 325 MG PO TABS
650.0000 mg | ORAL_TABLET | Freq: Once | ORAL | Status: AC | PRN
  Administered 2017-08-30: 650 mg via ORAL
  Filled 2017-08-30: qty 2

## 2017-08-30 MED ORDER — OSELTAMIVIR PHOSPHATE 75 MG PO CAPS: 75.0000 mg | ORAL_CAPSULE | Freq: Two times a day (BID) | ORAL | 0 refills | Status: DC

## 2017-08-30 NOTE — Discharge Instructions (Signed)
Please see the information and instructions below regarding your visit.  Your diagnoses today include:  1. Strep pharyngitis   2. Flu-like symptoms   3. Fever in other diseases   4. Weight loss, unintentional    Your swab is indicating that you have strep throat.  We will treated with penicillin for this.  Influenza is a viral respiratory illness that is most active in the community between November-March each Winter.  The most common symptoms in adults are fever, cough, sore throat, nasal congestion, headache, and muscle aches and pains. Some patients also get vomiting and diarrhea.   The acute phase of the illness can last 2-5 days during which you may continue to have body aches and low energy.  Tests performed today include: See side panel of your discharge paperwork for testing performed today. Vital signs are listed at the bottom of these instructions.   Influenza testing. Results will be available on your MyChart. Instructions on how to set up MyChart are listed in this packet.  Medications prescribed:    Take any prescribed medications only as prescribed, and any over the counter medications only as directed on the packaging.  Tamiflu (Oseltamivir). This is an antiviral medication. We treat in patients who the Centers for Disease Control and Prevention recommends be treated to prevent secondary complications of influenza. The most common side effects include headache, nausea, and vomiting.  For fever, you may take ibuprofen, a non-steroidal anti-inflammatory agent (NSAID) for pain. You may take 600mg  every 6 hours as needed for pain. If still requiring this medication around the clock for acute pain after 10 days, please see your primary healthcare provider.  Women who are pregnant, breastfeeding, or planning on becoming pregnant should not take non-steroidal anti-inflammatories such as   You may combine this medication with Tylenol, 650 mg every 6 hours, so you are receiving  something for pain every 3 hours.  This is not a long-term medication unless under the care and direction of your primary provider. Taking this medication long-term and not under the supervision of a healthcare provider could increase the risk of stomach ulcers, kidney problems, and cardiovascular problems such as high blood pressure.   Please take all of your antibiotics until finished.   You may develop abdominal discomfort or nausea from the antibiotic. If this occurs, you may take it with food. Some patients also get diarrhea with antibiotics. You may help offset this with probiotics which you can buy or get in yogurt. Do not eat or take the probiotics until 2 hours after your antibiotic. Some women develop vaginal yeast infections after antibiotics. If you develop unusual vaginal discharge after being on this medication, please see your primary care provider.   Some people develop allergies to antibiotics. Symptoms of antibiotic allergy can be mild and include a flat rash and itching. They can also be more serious and include:  ?Hives - Hives are raised, red patches of skin that are usually very itchy.  ?Lip or tongue swelling  ?Trouble swallowing or breathing  ?Blistering of the skin or mouth.  If you have any of these serious symptoms, please seek emergency medical care immediately.   Home care instructions:  Please follow any educational materials contained in this packet.   Your illness is contagious and can be spread to others, especially during the first 3 or 4 days. It cannot be cured by antibiotics or other medicines. Take basic precautions such as washing your hands often, covering your mouth when you  cough or sneeze, and avoiding public places where you could spread your illness to others.   Do not return to work, school, or other scheduled activities until you are without fever for 24 hours and not requiring ibuprofen (Advil) or acetaminophen (Tylenol) to reduce your  fever.  Please continue drinking plenty of fluids.  Use over-the-counter medicines as needed as directed on packaging for symptom relief.  You may also use ibuprofen or tylenol as directed on packaging for pain or fever.  Do not take multiple medicines containing Tylenol or acetaminophen to avoid taking too much of this medication.  Follow-up instructions: Please follow-up with your primary care provider in 48-72 for further evaluation of your symptoms if they are not completely improved.   Return instructions:  Please return to the Emergency Department if you experience worsening symptoms.  RETURN IMMEDIATELY IF you develop shortness of breath, chest pain, confusion or altered mental status, a new rash, become dizzy, faint, or poorly responsive, or are unable to be cared for at home. Some patients develop bacterial pneumonia as a result of influenza. Signs and symptoms include return of fever, increased production of green or yellow phlegm, chest pain, shortness of breath. This most commonly occurs 4-5 days after your initial infection. You need to be reevaluated if you experience these symptoms. Please return if you have persistent vomiting and cannot keep down fluids or develop a fever that is not controlled by tylenol or motrin.   Please return if you have any other emergent concerns.   Additional Information:   Your vital signs today were: BP (!) 128/92 (BP Location: Left Arm)    Pulse 95    Temp 98.8 F (37.1 C) (Oral)    Resp 14    Ht 5' 6.5" (1.689 m)    Wt 43.7 kg (96 lb 6.4 oz)    SpO2 100%    BMI 15.33 kg/m  If your blood pressure (BP) was elevated on multiple readings during this visit above 130 for the top number or above 80 for the bottom number, please have this repeated by your primary care provider within one month. --------------  Thank you for allowing us to participate in your care today.

## 2017-08-30 NOTE — ED Notes (Signed)
Pt reports having sore throat, fever/chills, body aches, lack of appetite/fluid intake for 2 days.
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
## 2017-08-30 NOTE — ED Triage Notes (Signed)
Patient complaining of sore throat, fever, and body aches that started around noon yesterday. Patient states that he hurts all over. Patient took an ibuprofen 800 mg around 5pm.

## 2017-08-31 LAB — INFLUENZA PANEL BY PCR (TYPE A & B)
INFLAPCR: POSITIVE — AB
INFLBPCR: NEGATIVE

## 2017-08-31 NOTE — Care Management Note (Signed)
Case Management Note  CM consulted for no pcp and no ins with need for follow up.  CM noted that all 3 clinics were placed on AVS for pt to schedule an appointment with.  CM will [facility] follow if an appointment opens.  No further CM needs noted at this time.

## 2017-09-02 ENCOUNTER — Telehealth: Payer: Self-pay

## 2017-09-02 NOTE — Telephone Encounter (Signed)
Message received fron Shona NeedlesAmgela Kritzer, RN CM requesting a hospital follow up appointment for the [facility]. Informed her that there are not any appointments available at this time. The contact numbers for [facility] were placed on the AVS as well as the number for [facility].  The patient can call to check on availability.

## 2017-09-03 ENCOUNTER — Telehealth: Payer: Self-pay

## 2017-09-03 NOTE — Telephone Encounter (Signed)
Call placed to patient #[phone], to schedule a hospital f/u and inform him of [facility] and [facility] services. Spoke with patient and scheduled hospital f/u appointment for 3/29 @ 9am. Informed patient that [facility] and get a physical with provider as well. Patient had concerns about paying for appointment and I informed him that he can meet with financial counselor and discuss assistance available to him. Patient understood and had no further questions.

## 2017-09-25 ENCOUNTER — Inpatient Hospital Stay (INDEPENDENT_AMBULATORY_CARE_PROVIDER_SITE_OTHER): Payer: Medicaid Other | Admitting: Physician Assistant

## 2018-01-15 ENCOUNTER — Ambulatory Visit (HOSPITAL_COMMUNITY)
Admission: EM | Admit: 2018-01-15 | Discharge: 2018-01-15 | Disposition: A | Discharge status: Home / Self Care | Payer: Medicaid Other | Attending: Internal Medicine | Admitting: Internal Medicine

## 2018-01-15 ENCOUNTER — Encounter (HOSPITAL_COMMUNITY): Payer: Self-pay

## 2018-01-15 DIAGNOSIS — M791 Myalgia, unspecified site: Secondary | ICD-10-CM

## 2018-01-15 DIAGNOSIS — M546 Pain in thoracic spine: Secondary | ICD-10-CM

## 2018-01-15 MED ORDER — IBUPROFEN 800 MG PO TABS
ORAL_TABLET | ORAL | Status: AC
  Filled 2018-01-15: qty 1

## 2018-01-15 MED ORDER — NAPROXEN 500 MG PO TABS: 500.0000 mg | ORAL_TABLET | Freq: Two times a day (BID) | ORAL | 0 refills | Status: DC

## 2018-01-15 MED ORDER — IBUPROFEN 800 MG PO TABS
800.0000 mg | ORAL_TABLET | Freq: Once | ORAL | Status: AC
  Administered 2018-01-15: 800 mg via ORAL

## 2018-01-15 NOTE — Discharge Instructions (Signed)
No danger signs on exam today.  Oral dose of ibuprofen 800 mg given at the urgent care, with note for work today.  Prescription for Naprosyn sent to the pharmacy.  Physical therapy for core strength/good lifting practices is often helpful for reducing episodes of back pain.  Please keep appointment on August 6 to establish care with a primary care provider [address].

## 2018-01-15 NOTE — ED Provider Notes (Signed)
[facility]    CSN: [id number] Arrival date & time: 01/15/18  1148     History   Chief Complaint Chief Complaint  Patient presents with  . Back Pain    HPI Earl Kim is a 27 y.o. male.   He reports a history of scoliosis.  He presents today with onset yesterday of back discomfort in the bilateral rhomboid and left periscapular areas.  He says the pain was severe.  His grandmother gave him a Percocet and he was able to sleep last night.  He says that he is out of tramadol which he has used previously for flareups of back pain with good success.  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  No unusual activities, no trauma, preceding onset of back pain.  He was able to walk into the urgent care independently.  No weakness/clumsiness of the legs, no change in bowel or bladder function, no loss of sensation.    HPI  History reviewed. No pertinent past medical history.   History reviewed. No pertinent surgical history.     Home Medications    Prior to Admission medications   Medication Sig Start Date End Date Taking? Authorizing Provider  cyclobenzaprine (FLEXERIL) 10 MG tablet Take 1 tablet (10 mg total) 2 (two) times daily as needed by mouth for muscle spasms. 05/16/17   Janne NapoleonNeese, Hope M, NP  diclofenac (VOLTAREN) 50 MG EC tablet Take 1 tablet (50 mg total) 2 (two) times daily by mouth. 05/16/17   Janne NapoleonNeese, Hope M, NP  ibuprofen (ADVIL,MOTRIN) 200 MG tablet Take 800 mg every 6 (six) hours as needed by mouth for moderate pain.    [provider]  naproxen (NAPROSYN) 500 MG tablet Take 1 tablet (500 mg total) by mouth 2 (two) times daily. 01/15/18   Isa RankinMurray, Jayona Mccaig Wilson, MD    Family History Family History  Problem Relation Age of Onset  . Hypertension Mother     Social History Social History   Tobacco Use  . Smoking status: Current Every Day Smoker    Packs/day: 0.50  .  Smokeless tobacco: Never Used  Substance Use Topics  . Alcohol use: No    Alcohol/week: 1.2 oz    Types: 2 Shots of liquor per week    Comment: on weekends  . Drug use: No     Allergies   Patient has no known allergies.   Review of Systems Review of Systems  All other systems reviewed and are negative.    Physical Exam Triage Vital Signs ED Triage Vitals  Enc Vitals Group     BP 01/15/18 1230 105/71     Pulse Rate 01/15/18 1230 87     Resp 01/15/18 1230 20     Temp 01/15/18 1230 99.1 F (37.3 C)     Temp Source 01/15/18 1230 Temporal     SpO2 01/15/18 1230 100 %     Weight --      Height --      Pain Score 01/15/18 1229 6     Pain Loc --    Updated Vital Signs BP 105/71 (BP Location: Left Arm)   Pulse 87   Temp 99.1 F (37.3 C) (Temporal)   Resp 20   SpO2 100%  Physical Exam  Constitutional: He is oriented to person, place, and time. No distress.  Alert, nicely groomed  HENT:  Head: Atraumatic.  Eyes:  Conjugate gaze, no eye redness/drainage  Neck: Neck supple.  Cardiovascular: Normal rate.  Pulmonary/Chest: No respiratory distress.  Lungs clear, symmetric breath sounds  Abdominal: He exhibits no distension.  Musculoskeletal: Normal range of motion.  Diffuse discomfort to palpation in the bilateral rhomboids and left parascapular/left lateral thoracic areas.  No focal bony tenderness, no erythema/bruising/focal swelling.  Moves quickly and easily from lying to sitting on the exam table.  Neurological: He is alert and oriented to person, place, and time.  Skin: Skin is warm and dry.  No cyanosis  Nursing note and vitals reviewed.    UC Treatments / Results    Medications Ordered in UC Medications  ibuprofen (ADVIL,MOTRIN) tablet 800 mg (800 mg Oral Given 01/15/18 1322)    Final Clinical Impressions(s) / UC Diagnoses   Final diagnoses:  Muscular pain  Acute left-sided thoracic back pain     Discharge Instructions     No danger signs on  exam today.  Oral dose of ibuprofen 800 mg given at the urgent care, with note for work today.  Prescription for Naprosyn sent to the pharmacy.  Physical therapy for core strength/good lifting practices is often helpful for reducing episodes of back pain.  Please keep appointment on August 6 to establish care with a primary care provider on [facility].    ED Prescriptions    Medication Sig Dispense Auth. Provider   naproxen (NAPROSYN) 500 MG tablet Take 1 tablet (500 mg total) by mouth 2 (two) times daily. 14 tablet Isa Rankin, MD       Isa Rankin, MD 01/18/18 2049

## 2018-01-15 NOTE — ED Triage Notes (Signed)
Pt presents with generalized back pain from ongoing back condition.

## 2018-02-02 ENCOUNTER — Encounter (INDEPENDENT_AMBULATORY_CARE_PROVIDER_SITE_OTHER): Payer: Self-pay | Admitting: Physician Assistant

## 2018-02-02 ENCOUNTER — Other Ambulatory Visit: Payer: Self-pay

## 2018-02-02 ENCOUNTER — Ambulatory Visit (INDEPENDENT_AMBULATORY_CARE_PROVIDER_SITE_OTHER): Payer: Self-pay | Admitting: Physician Assistant

## 2018-02-02 VITALS — BP 123/72 | HR 43 | Temp 97.7°F | Ht 66.5 in | Wt 100.2 lb

## 2018-02-02 DIAGNOSIS — M546 Pain in thoracic spine: Secondary | ICD-10-CM

## 2018-02-02 DIAGNOSIS — G8929 Other chronic pain: Secondary | ICD-10-CM

## 2018-02-02 DIAGNOSIS — Z0000 Encounter for general adult medical examination without abnormal findings: Secondary | ICD-10-CM

## 2018-02-02 NOTE — Patient Instructions (Signed)

## 2018-02-02 NOTE — Progress Notes (Signed)
Subjective:  Patient ID: Earl Kim, male    DOB: [birth year]  Age: 27 y.o. MRN: [id number]  CC: work physical  HPI Earl Kim is a 27 y.o. male with a medical history of scoliosis? Presents for a work physical. Says he feels well except for occasional back pain since 11 years ago. His mother told him he has scoliosis. Pain worse with cold or rainy weather. No back pain with lifting unless the load is heavy, eg furniture. Has never had Does not endorse any other symptoms or complaints.       Outpatient Medications Prior to Visit  Medication Sig Dispense Refill  . ibuprofen (ADVIL,MOTRIN) 200 MG tablet Take 800 mg every 6 (six) hours as needed by mouth for moderate pain.    . cyclobenzaprine (FLEXERIL) 10 MG tablet Take 1 tablet (10 mg total) 2 (two) times daily as needed by mouth for muscle spasms. 20 tablet 0  . diclofenac (VOLTAREN) 50 MG EC tablet Take 1 tablet (50 mg total) 2 (two) times daily by mouth. 15 tablet 0  . naproxen (NAPROSYN) 500 MG tablet Take 1 tablet (500 mg total) by mouth 2 (two) times daily. 14 tablet 0   No facility-administered medications prior to visit.      ROS Review of Systems  Constitutional: Negative for chills, fever and malaise/fatigue.  Eyes: Negative for blurred vision.  Respiratory: Negative for shortness of breath.   Cardiovascular: Negative for chest pain and palpitations.  Gastrointestinal: Negative for abdominal pain and nausea.  Genitourinary: Negative for dysuria and hematuria.  Musculoskeletal: Positive for back pain. Negative for joint pain and myalgias.  Skin: Negative for rash.  Neurological: Negative for tingling and headaches.  Psychiatric/Behavioral: Negative for depression. The patient is not nervous/anxious.     Objective:  BP 123/72 (BP Location: Right Arm, Patient Position: Sitting, Cuff Size: Small)   Pulse (!) 43   Temp 97.7 F (36.5 C) (Oral)   Ht 5' 6.5" (1.689 m)   Wt 100 lb 3.2 oz (45.5 kg)   SpO2  92%   BMI 15.93 kg/m   BP/Weight 02/02/2018 01/15/2018 08/30/2017  Systolic BP 123 105 128  Diastolic BP 72 71 92  Wt. (Lbs) 100.2 - 96.4  BMI 15.93 - 15.33      Physical Exam  Constitutional: He is oriented to person, place, and time.  Well developed, well nourished, NAD, polite  HENT:  Head: Normocephalic and atraumatic.  Eyes: No scleral icterus.  Neck: Normal range of motion. Neck supple. No thyromegaly present.  Cardiovascular: Normal rate, regular rhythm and normal heart sounds.  Pulmonary/Chest: Effort normal and breath sounds normal.  Abdominal: Soft. Bowel sounds are normal. There is no tenderness.  Musculoskeletal: He exhibits no edema.  Full aROM of back, UEs, and LEs. No obvious scoliosis noted but there is mild kyphosis.  Neurological: He is alert and oriented to person, place, and time.  Skin: Skin is warm and dry. No rash noted. No erythema. No pallor.  Psychiatric: He has a normal mood and affect. His behavior is normal. Thought content normal.  Vitals reviewed.    Assessment & Plan:    1. Annual physical exam - CBC with Differential - Comprehensive metabolic panel  2. Chronic midline thoracic back pain - XR Thoracic Spine 2 View - DG Lumbar Spine Complete; Future     Follow-up: Return if symptoms worsen or fail to improve, for back pain.   Loletta Specteroger David Meriel Kelliher PA

## 2018-02-03 ENCOUNTER — Telehealth (INDEPENDENT_AMBULATORY_CARE_PROVIDER_SITE_OTHER): Payer: Self-pay

## 2018-02-03 LAB — COMPREHENSIVE METABOLIC PANEL
A/G RATIO: 2 (ref 1.2–2.2)
ALBUMIN: 4.8 g/dL (ref 3.5–5.5)
ALK PHOS: 63 IU/L (ref 39–117)
ALT: 14 IU/L (ref 0–44)
AST: 20 IU/L (ref 0–40)
BILIRUBIN TOTAL: 0.4 mg/dL (ref 0.0–1.2)
BUN / CREAT RATIO: 14 (ref 9–20)
BUN: 15 mg/dL (ref 6–20)
CHLORIDE: 102 mmol/L (ref 96–106)
CO2: 23 mmol/L (ref 20–29)
Calcium: 9.5 mg/dL (ref 8.7–10.2)
Creatinine, Ser: 1.11 mg/dL (ref 0.76–1.27)
GFR calc non Af Amer: 92 mL/min/{1.73_m2} (ref >59)
GFR, EST AFRICAN AMERICAN: 107 mL/min/{1.73_m2} (ref >59)
GLUCOSE: 83 mg/dL (ref 65–99)
Globulin, Total: 2.4 g/dL (ref 1.5–4.5)
POTASSIUM: 4.8 mmol/L (ref 3.5–5.2)
Sodium: 142 mmol/L (ref 134–144)
TOTAL PROTEIN: 7.2 g/dL (ref 6.0–8.5)

## 2018-02-03 LAB — CBC WITH DIFFERENTIAL/PLATELET
BASOS ABS: 0 10*3/uL (ref 0.0–0.2)
Basos: 1 %
EOS (ABSOLUTE): 0.1 10*3/uL (ref 0.0–0.4)
Eos: 5 %
Hematocrit: 51.4 % — ABNORMAL HIGH (ref 37.5–51.0)
Hemoglobin: 16.7 g/dL (ref 13.0–17.7)
Immature Grans (Abs): 0 10*3/uL (ref 0.0–0.1)
Immature Granulocytes: 0 %
LYMPHS ABS: 1.5 10*3/uL (ref 0.7–3.1)
Lymphs: 49 %
MCH: 30.6 pg (ref 26.6–33.0)
MCHC: 32.5 g/dL (ref 31.5–35.7)
MCV: 94 fL (ref 79–97)
MONOS ABS: 0.4 10*3/uL (ref 0.1–0.9)
Monocytes: 12 %
NEUTROS ABS: 1 10*3/uL — AB (ref 1.4–7.0)
Neutrophils: 33 %
PLATELETS: 192 10*3/uL (ref 150–450)
RBC: 5.46 x10E6/uL (ref 4.14–5.80)
RDW: 13.3 % (ref 12.3–15.4)
WBC: 3 10*3/uL — AB (ref 3.4–10.8)

## 2018-02-03 NOTE — Telephone Encounter (Signed)
-----   Message from Loletta Specteroger David Gomez, PA-C sent at 02/03/2018  1:43 PM EDT ----- WBCs are low. He should make f/u appointment to see me for further testing to find cause of low WBCs.

## 2018-02-03 NOTE — Telephone Encounter (Signed)
[facility] to determine [facility]. Patient is scheduled for tomorrow at 1:50. Maryjean Mornempestt S Roberts, CMA

## 2018-02-04 ENCOUNTER — Ambulatory Visit (INDEPENDENT_AMBULATORY_CARE_PROVIDER_SITE_OTHER): Payer: Self-pay | Admitting: Physician Assistant

## 2018-02-04 ENCOUNTER — Other Ambulatory Visit: Payer: Self-pay

## 2018-02-04 ENCOUNTER — Encounter (INDEPENDENT_AMBULATORY_CARE_PROVIDER_SITE_OTHER): Payer: Self-pay | Admitting: Physician Assistant

## 2018-02-04 VITALS — BP 128/78 | HR 66 | Temp 97.7°F | Ht 66.5 in | Wt 98.6 lb

## 2018-02-04 DIAGNOSIS — Z114 Encounter for screening for human immunodeficiency virus [HIV]: Secondary | ICD-10-CM

## 2018-02-04 DIAGNOSIS — D709 Neutropenia, unspecified: Secondary | ICD-10-CM

## 2018-02-04 NOTE — Progress Notes (Signed)
   Subjective:  Patient ID: Earl Kim, male    DOB: 09-25-1990  Age: 27 y.o. MRN: [id number]  CC: neutropenia  HPI Earl Kim is a 27 y.o. male with a medical history of scoliosis? Presents to discuss recent finding of neutropenia. WBC 3.0 x10E3/uL revealed in CBC yesterday. Pt feels well overall except for having a poor appetite. Partner accompanies patient and says he usually drinks [employer] "all day". Pt has since began to drink water. Pt concerned about meaning of neutropenia.         Outpatient Medications Prior to Visit  Medication Sig Dispense Refill  . ibuprofen (ADVIL,MOTRIN) 200 MG tablet Take 800 mg every 6 (six) hours as needed by mouth for moderate pain.     No facility-administered medications prior to visit.      ROS Review of Systems  Constitutional: Negative for chills, fever and malaise/fatigue.  Eyes: Negative for blurred vision.  Respiratory: Negative for shortness of breath.   Cardiovascular: Negative for chest pain and palpitations.  Gastrointestinal: Negative for abdominal pain and nausea.  Genitourinary: Negative for dysuria and hematuria.  Musculoskeletal: Negative for joint pain and myalgias.  Skin: Negative for rash.  Neurological: Negative for tingling and headaches.  Psychiatric/Behavioral: Negative for depression. The patient is not nervous/anxious.     Objective:  BP 128/78 (BP Location: Right Arm, Patient Position: Sitting, Cuff Size: Small)   Pulse 66   Temp 97.7 F (36.5 C) (Oral)   Ht 5' 6.5" (1.689 m)   Wt 98 lb 9.6 oz (44.7 kg)   SpO2 100%   BMI 15.68 kg/m   BP/Weight 02/04/2018 02/02/2018 01/15/2018  Systolic BP 128 123 105  Diastolic BP 78 72 71  Wt. (Lbs) 98.6 100.2 -  BMI 15.68 15.93 -      Physical Exam  Constitutional: He is oriented to person, place, and time.  Well developed, thin, NAD, polite  HENT:  Head: Normocephalic and atraumatic.  Eyes: No scleral icterus.  Neck: Normal range of motion.  Neck supple. No thyromegaly present.  Pulmonary/Chest: Effort normal.  Musculoskeletal: He exhibits no edema.  Neurological: He is alert and oriented to person, place, and time.  Skin: Skin is warm and dry. No rash noted. No erythema. No pallor.  Psychiatric: His behavior is normal. Thought content normal.  Anxious/worried  Vitals reviewed.    Assessment & Plan:    1. Neutropenia, unspecified type (HCC) - B12 and Folate Panel - ANA w/Reflex - TSH - Hepatitis panel, acute - Drug Screen, Urine  2. Screening for HIV (human immunodeficiency virus) - HIV antibody     Follow-up: Return if symptoms worsen or fail to improve.   Loletta Specteroger David Haasini Patnaude PA

## 2018-02-04 NOTE — Patient Instructions (Signed)
Leukopenia Leukopenia is a condition in which you have a low number of white blood cells. White blood cells help the body to fight infections. The number of white blood cells in the body varies from person to person. There are five types of white blood cells. Two types (lymphocytes and neutrophils) make up most of the white blood cell count. When lymphocytes are low, the condition is called lymphocytopenia. When neutrophils are low, it is called neutropenia. Neutropenia is the most dangerous type of leukopenia because it can lead to dangerous infections. What are the causes? This condition is commonly caused by damage to soft tissue inside of the bones (bone marrow), which is where most white blood cells are made. Bone marrow can get damaged by:  Medicine or X-ray treatments for cancer (chemotherapy or radiation therapy).  Serious infections.  Cancer of the white blood cells (leukemia, lymphoma, or myeloma).  Medicines, including: ? Certain antibiotics. ? Certain heart medicines. ? Steroids. ? Certain medicines used to treat diseases of the immune system (autoimmune diseases), like rheumatoid arthritis.  Leukopenia also happens when white blood cells are destroyed after leaving the bone marrow, which may result from:  Liver disease.  Autoimmune disease.  Vitamin B deficiencies.  What are the signs or symptoms? One of the most common signs of leukopenia, especially severe neutropenia, is having a lot of bacterial infections. Different infections have different symptoms. An infection in your lungs may cause coughing. A urinary tract infection may cause frequent urination and a burning sensation. You may also get infections of the blood, skin, rectum, throat, sinuses, or ears. Some people have no symptoms. If you do have symptoms, they may include:  Fever.  Fatigue.  Swollen glands (lymph nodes).  Painful mouth ulcers.  Gum disease.  How is this diagnosed? This condition may be  diagnosed based on:  Your medical history.  A physical exam to check for swollen lymph nodes and an enlarged spleen. Your spleen is an organ on the left side of your body that stores white blood cells.  Tests, such as: ? A complete blood count. This blood test counts each type of white cell. ? Bone marrow aspiration. Some bone marrow is removed to be checked under a microscope. ? Lymph node biopsy. Some lymph node tissue is removed to be checked under a microscope. ? Other types of blood tests or imaging tests.  How is this treated? Treatment of leukopenia depends on the cause. Some common treatments include:  Antibiotic medicine to treat bacterial infections.  Stopping medicines that may cause leukopenia.  Medicines to stimulate neutrophil production (hematopoietic growth factors), to treat neutropenia.  Follow these instructions at home:  Take over-the-counter and prescription medicines only as told by your health care provider. This includes supplements and vitamins.  If you were prescribed an antibiotic medicine, take it as told by your health care provider. Do not stop taking the antibiotic even if you start to feel better.  Preventing infection is important if you have leukopenia. To prevent infection: ? Avoid close contact with sick people. ? Wash your hands frequently with soap and water. If soap and water are not available, use hand sanitizer. ? Do not&nbsp;eat uncooked or undercooked meats. ? Wash fruits and vegetables before eating them. ? Do not eat or drink unpasteurized dairy products. ? Get regular dental care, and maintain good dental hygiene. You should visit the dentist at least once every 6 months.  Keep all follow-up visits as told by your health care   provider. This is important. Contact a health care provider if:  You have chills or a fever.  You have symptoms of an infection. Get help right away if:  You have a fever that lasts for more than 2-3  days.  You have symptoms that last for more than 2-3 days.  You have trouble breathing.  You have chest pain. This information is not intended to replace advice given to you by your health care provider. Make sure you discuss any questions you have with your health care provider. Document Released: 06/21/2013 Document Revised: 05/06/2016 Document Reviewed: 05/06/2016 Elsevier Interactive Patient Education  2018 [employer]  

## 2018-02-05 LAB — HEPATITIS PANEL, ACUTE
HEP B C IGM: NEGATIVE
Hep A IgM: NEGATIVE
Hep C Virus Ab: <0.1 s/co ratio (ref 0.0–0.9)
Hepatitis B Surface Ag: NEGATIVE

## 2018-02-05 LAB — HIV ANTIBODY (ROUTINE TESTING W REFLEX): HIV SCREEN 4TH GENERATION: NONREACTIVE

## 2018-02-05 LAB — TSH: TSH: 0.856 u[IU]/mL (ref 0.450–4.500)

## 2018-02-05 LAB — ANA W/REFLEX: Anti Nuclear Antibody(ANA): NEGATIVE

## 2018-02-05 LAB — B12 AND FOLATE PANEL
Folate: 6.1 ng/mL (ref >3.0)
VITAMIN B 12: 378 pg/mL (ref 232–1245)

## 2018-02-08 LAB — URINE DRUG PANEL 7
Amphetamines, Urine: NEGATIVE ng/mL
BARBITURATE QUANT UR: NEGATIVE ng/mL
BENZODIAZEPINE QUANT UR: NEGATIVE ng/mL
Cannabinoid Quant, Ur: POSITIVE — AB
Cocaine (Metab.): NEGATIVE ng/mL
OPIATE QUANT UR: NEGATIVE ng/mL
PCP Quant, Ur: NEGATIVE ng/mL

## 2018-02-10 ENCOUNTER — Telehealth (INDEPENDENT_AMBULATORY_CARE_PROVIDER_SITE_OTHER): Payer: Self-pay

## 2018-02-10 NOTE — Telephone Encounter (Signed)
-----   Message from Loletta Specteroger David Gomez, PA-C sent at 02/08/2018  9:03 AM EDT ----- All results negative. No vitamin B12 or folate deficiency. Negative HIV and Hepatitis. Only remarkable result is of positive cannabinoid.

## 2018-02-10 NOTE — Telephone Encounter (Signed)
Patient is aware that all results are negative. No vitamin B12 or folate deficiency. Negative HIV and Hepatitis.UDS Positive for cannabinoid. Earl Kim, CMA

## 2018-07-20 ENCOUNTER — Ambulatory Visit (HOSPITAL_COMMUNITY)
Admission: EM | Admit: 2018-07-20 | Discharge: 2018-07-20 | Disposition: A | Discharge status: Home / Self Care | Payer: Medicaid Other | Attending: Physician Assistant | Admitting: Physician Assistant

## 2018-07-20 ENCOUNTER — Encounter (HOSPITAL_COMMUNITY): Payer: Self-pay

## 2018-07-20 DIAGNOSIS — M79674 Pain in right toe(s): Secondary | ICD-10-CM

## 2018-07-20 DIAGNOSIS — W2203XA Walked into furniture, initial encounter: Secondary | ICD-10-CM

## 2018-07-20 NOTE — ED Provider Notes (Signed)
07/20/2018 6:30 PM   DOB: 1991-03-07 / MRN: [id number]  SUBJECTIVE:  Earl Kim is a 28 y.o. male presenting for *painful right 5 th digit that started after he hit in on a cooffee table a few night ago.  Walking makes the pain worse. He tells me there is swelling as well.   He has No Known Allergies.   He  has no past medical history on file.    He  reports that he has been smoking. He has been smoking about 0.50 packs per day. He has never used smokeless tobacco. He reports that he does not drink alcohol or use drugs. He  reports being sexually active. The patient  has no past surgical history on file.  His family history includes Hypertension in his mother.  Review of Systems  Musculoskeletal: Positive for joint pain.    OBJECTIVE:  BP 110/72 (BP Location: Left Arm)   Pulse 71   Temp 98.5 F (36.9 C) (Oral)   Resp 18   SpO2 99%   Wt Readings from Last 3 Encounters:  02/04/18 98 lb 9.6 oz (44.7 kg)  02/02/18 100 lb 3.2 oz (45.5 kg)  08/30/17 96 lb 6.4 oz (43.7 kg)   Temp Readings from Last 3 Encounters:  07/20/18 98.5 F (36.9 C) (Oral)  02/04/18 97.7 F (36.5 C) (Oral)  02/02/18 97.7 F (36.5 C) (Oral)   BP Readings from Last 3 Encounters:  07/20/18 110/72  02/04/18 128/78  02/02/18 123/72   Pulse Readings from Last 3 Encounters:  07/20/18 71  02/04/18 66  02/02/18 (!) 43    Physical Exam Musculoskeletal:       Feet:     No results found for this or any previous visit (from the past 72 hour(s)).  No results found.  ASSESSMENT AND PLAN:   Toe pain, right - No deformity on exam.  Advised post op shoe and motrin 800 tid.    Discharge Instructions     Motrin 800 tid         The patient is advised to call or return to clinic if he does not see an improvement in symptoms, or to seek the care of the closest emergency department if he worsens with the above plan.   Deliah Boston, MHS, PA-C 07/20/2018 6:30 PM   Earl Neas,  PA-C 07/20/18 1831

## 2018-07-20 NOTE — Discharge Instructions (Signed)
Motrin 800 tid

## 2018-07-20 NOTE — ED Triage Notes (Signed)
Pt states hit his rt last two toes on a coffee table 2 nights ago and the pain and swelling continues.

## 2019-03-21 ENCOUNTER — Other Ambulatory Visit: Payer: Self-pay

## 2019-03-21 ENCOUNTER — Encounter (HOSPITAL_COMMUNITY): Payer: Self-pay | Admitting: Emergency Medicine

## 2019-03-21 ENCOUNTER — Ambulatory Visit (HOSPITAL_COMMUNITY)
Admission: EM | Admit: 2019-03-21 | Discharge: 2019-03-21 | Disposition: A | Discharge status: Home / Self Care | Payer: HRSA Program | Attending: Family Medicine | Admitting: Family Medicine

## 2019-03-21 DIAGNOSIS — Z0189 Encounter for other specified special examinations: Secondary | ICD-10-CM

## 2019-03-21 DIAGNOSIS — Z20828 Contact with and (suspected) exposure to other viral communicable diseases: Secondary | ICD-10-CM | POA: Diagnosis not present

## 2019-03-21 NOTE — ED Triage Notes (Signed)
Patient reports his work place requested he get tested for covid since he has been to [employer].  03/11/2019 arrived in [place], returned back to [place] on 03/14/2019.  Patient flew to and from .    Denies symptoms

## 2019-03-21 NOTE — ED Provider Notes (Signed)
[facility]    CSN: [id number] Arrival date & time: 03/21/19  1141      History   Chief Complaint Chief Complaint  Patient presents with  . Labs Only    HPI Earl Kim is a 28 y.o. male.   HPI Patient went to [place] on vacation.  Upon returning learned that he is not allowed to return to work until he has had a negative coronavirus test.  He is here today requesting COVID 19 swab.  He feels well.  He has not been around anyone known to be sick. History reviewed. No pertinent past medical history.  There are no active problems to display for this patient.   History reviewed. No pertinent surgical history.     Home Medications    Prior to Admission medications   Medication Sig Start Date End Date Taking? Authorizing Provider  ibuprofen (ADVIL,MOTRIN) 200 MG tablet Take 800 mg every 6 (six) hours as needed by mouth for moderate pain.    [provider]    Family History Family History  Problem Relation Age of Onset  . Hypertension Mother     Social History Social History   Tobacco Use  . Smoking status: Current Every Day Smoker    Packs/day: 0.50  . Smokeless tobacco: Never Used  Substance Use Topics  . Alcohol use: No    Alcohol/week: 2.0 standard drinks    Types: 2 Shots of liquor per week    Comment: on weekends  . Drug use: No     Allergies   Patient has no known allergies.   Review of Systems Review of Systems  Constitutional: Negative for chills and fever.  HENT: Negative for ear pain and sore throat.   Eyes: Negative for pain and visual disturbance.  Respiratory: Negative for cough and shortness of breath.   Cardiovascular: Negative for chest pain and palpitations.  Gastrointestinal: Negative for abdominal pain and vomiting.  Genitourinary: Negative for dysuria and hematuria.  Musculoskeletal: Negative for arthralgias and back pain.  Skin: Negative for color change and rash.  Neurological: Negative for  seizures and syncope.  All other systems reviewed and are negative.    Physical Exam Triage Vital Signs ED Triage Vitals  Enc Vitals Group     BP 03/21/19 1214 119/90     Pulse Rate 03/21/19 1214 87     Resp 03/21/19 1214 16     Temp 03/21/19 1214 98.1 F (36.7 C)     Temp Source 03/21/19 1214 Temporal     SpO2 03/21/19 1214 100 %     Weight --      Height --      Head Circumference --      Peak Flow --      Pain Score 03/21/19 1219 0     Pain Loc --      Pain Edu? --      Excl. in GC? --    No data found.  Updated Vital Signs BP 119/90 (BP Location: Left Arm)   Pulse 87   Temp 98.1 F (36.7 C) (Temporal)   Resp 16   SpO2 100%      Physical Exam Constitutional:      General: He is not in acute distress.    Appearance: He is well-developed and normal weight.  HENT:     Head: Normocephalic and atraumatic.  Eyes:     Conjunctiva/sclera: Conjunctivae normal.     Pupils: Pupils are equal, round, and reactive  to light.  Neck:     Musculoskeletal: Normal range of motion.  Cardiovascular:     Rate and Rhythm: Normal rate.  Pulmonary:     Effort: Pulmonary effort is normal. No respiratory distress.  Abdominal:     General: There is no distension.     Palpations: Abdomen is soft.  Musculoskeletal: Normal range of motion.  Skin:    General: Skin is warm and dry.  Neurological:     Mental Status: He is alert.  Psychiatric:        Behavior: Behavior normal.      UC Treatments / Results  Labs (all labs ordered are listed, but only abnormal results are displayed) Labs Reviewed  NOVEL CORONAVIRUS, NAA (HOSP ORDER, SEND-OUT TO REF LAB; TAT 18-24 HRS)    EKG   Radiology No results found.  Procedures Procedures (including critical care time)  Medications Ordered in UC Medications - No data to display  Initial Impression / Assessment and Plan / UC Course  I have reviewed the triage vital signs and the nursing notes.  Pertinent labs & imaging results  that were available during my care of the patient were reviewed by me and considered in my medical decision making (see chart for details).    Testing only Final Clinical Impressions(s) / UC Diagnoses   Final diagnoses:  Encounter for laboratory examination     Discharge Instructions        Person Under Monitoring Name: Earl Kim  Location: [place] [address]   Infection Prevention Recommendations for Individuals Confirmed to have, or Being Evaluated for, 2019 Novel Coronavirus (COVID-19) Infection Who Receive Care at Home  Individuals who are confirmed to have, or are being evaluated for, COVID-19 should follow the prevention steps below until a healthcare provider or local or state health department says they can return to normal activities.  Stay home except to get medical care You should restrict activities outside your home, except for getting medical care. Do not go to work, school, or public areas, and do not use public transportation or taxis.  Call ahead before visiting your doctor Before your medical appointment, call the healthcare provider and tell them that you have, or are being evaluated for, COVID-19 infection. This will help the healthcare provider's office take steps to keep other people from getting infected. Ask your healthcare provider to call the local or state health department.  Monitor your symptoms Seek prompt medical attention if your illness is worsening (e.g., difficulty breathing). Before going to your medical appointment, call the healthcare provider and tell them that you have, or are being evaluated for, COVID-19 infection. Ask your healthcare provider to call the local or state health department.  Wear a facemask You should wear a facemask that covers your nose and mouth when you are in the same room with other people and when you visit a healthcare provider. People who live with or visit you  should also wear a facemask while they are in the same room with you.  Separate yourself from other people in your home As much as possible, you should stay in a different room from other people in your home. Also, you should use a separate bathroom, if available.  Avoid sharing household items You should not share dishes, drinking glasses, cups, eating utensils, towels, bedding, or other items with other people in your home. After using these items, you should wash them thoroughly with soap and water.  Cover  your coughs and sneezes Cover your mouth and nose with a tissue when you cough or sneeze, or you can cough or sneeze into your sleeve. Throw used tissues in a lined trash can, and immediately wash your hands with soap and water for at least 20 seconds or use an alcohol-based hand rub.  Wash your [employer] your hands often and thoroughly with soap and water for at least 20 seconds. You can use an alcohol-based hand sanitizer if soap and water are not available and if your hands are not visibly dirty. Avoid touching your eyes, nose, and mouth with unwashed hands.   Prevention Steps for Caregivers and Household Members of Individuals Confirmed to have, or Being Evaluated for, COVID-19 Infection Being Cared for in the Home  If you live with, or provide care at home for, a person confirmed to have, or being evaluated for, COVID-19 infection please follow these guidelines to prevent infection:  Follow healthcare provider's instructions Make sure that you understand and can help the patient follow any healthcare provider instructions for all care.  Provide for the patient's basic needs You should help the patient with basic needs in the home and provide support for getting groceries, prescriptions, and other personal needs.  Monitor the patient's symptoms If they are getting sicker, call his or her medical provider and tell them that the patient has, or is being evaluated for,  COVID-19 infection. This will help the healthcare provider's office take steps to keep other people from getting infected. Ask the healthcare provider to call the local or state health department.  Limit the number of people who have contact with the patient  If possible, have only one caregiver for the patient.  Other household members should stay in another home or place of residence. If this is not possible, they should stay  in another room, or be separated from the patient as much as possible. Use a separate bathroom, if available.  Restrict visitors who do not have an essential need to be in the home.  Keep older adults, very young children, and other sick people away from the patient Keep older adults, very young children, and those who have compromised immune systems or chronic health conditions away from the patient. This includes people with chronic heart, lung, or kidney conditions, diabetes, and cancer.  Ensure good ventilation Make sure that shared spaces in the home have good air flow, such as from an air conditioner or an opened window, weather permitting.  Wash your hands often  Wash your hands often and thoroughly with soap and water for at least 20 seconds. You can use an alcohol based hand sanitizer if soap and water are not available and if your hands are not visibly dirty.  Avoid touching your eyes, nose, and mouth with unwashed hands.  Use disposable paper towels to dry your hands. If not available, use dedicated cloth towels and replace them when they become wet.  Wear a facemask and gloves  Wear a disposable facemask at all times in the room and gloves when you touch or have contact with the patient's blood, body fluids, and/or secretions or excretions, such as sweat, saliva, sputum, nasal mucus, vomit, urine, or feces.  Ensure the mask fits over your nose and mouth tightly, and do not touch it during use.  Throw out disposable facemasks and gloves after using  them. Do not reuse.  Wash your hands immediately after removing your facemask and gloves.  If your personal clothing becomes contaminated,  carefully remove clothing and launder. Wash your hands after handling contaminated clothing.  Place all used disposable facemasks, gloves, and other waste in a lined container before disposing them with other household waste.  Remove gloves and wash your hands immediately after handling these items.  Do not share dishes, glasses, or other household items with the patient  Avoid sharing household items. You should not share dishes, drinking glasses, cups, eating utensils, towels, bedding, or other items with a patient who is confirmed to have, or being evaluated for, COVID-19 infection.  After the person uses these items, you should wash them thoroughly with soap and water.  Wash laundry thoroughly  Immediately remove and wash clothes or bedding that have blood, body fluids, and/or secretions or excretions, such as sweat, saliva, sputum, nasal mucus, vomit, urine, or feces, on them.  Wear gloves when handling laundry from the patient.  Read and follow directions on labels of laundry or clothing items and detergent. In general, wash and dry with the warmest temperatures recommended on the label.  Clean all areas the individual has used often  Clean all touchable surfaces, such as counters, tabletops, doorknobs, bathroom fixtures, toilets, phones, keyboards, tablets, and bedside tables, every day. Also, clean any surfaces that may have blood, body fluids, and/or secretions or excretions on them.  Wear gloves when cleaning surfaces the patient has come in contact with.  Use a diluted bleach solution (e.g., dilute bleach with 1 part bleach and 10 parts water) or a household disinfectant with a label that says EPA-registered for coronaviruses. To make a bleach solution at home, add 1 tablespoon of bleach to 1 quart (4 cups) of water. For a larger supply,  add  cup of bleach to 1 gallon (16 cups) of water.  Read labels of cleaning products and follow recommendations provided on product labels. Labels contain instructions for safe and effective use of the cleaning product including precautions you should take when applying the product, such as wearing gloves or eye protection and making sure you have good ventilation during use of the product.  Remove gloves and wash hands immediately after cleaning.  Monitor yourself for signs and symptoms of illness Caregivers and household members are considered close contacts, should monitor their health, and will be asked to limit movement outside of the home to the extent possible. Follow the monitoring steps for close contacts listed on the symptom monitoring form.   ? If you have additional questions, contact your local health department or call the epidemiologist on call at [phone] (available 24/7). ? This guidance is subject to change. For the most up-to-date guidance from [facility], please refer to their website: TripMetro.huhttps://www.cdc.gov/coronavirus/2019-ncov/hcp/guidance-prevent-spread.html    ED Prescriptions    None     PDMP not reviewed this encounter.   Eustace MooreNelson,  Sue, MD 03/21/19 1250

## 2019-03-21 NOTE — Discharge Instructions (Signed)
Person Under Monitoring Name: Earl Kim  Location: [place] [address]   Infection Prevention Recommendations for Individuals Confirmed to have, or Being Evaluated for, 2019 Novel Coronavirus (COVID-19) Infection Who Receive Care at Home  Individuals who are confirmed to have, or are being evaluated for, COVID-19 should follow the prevention steps below until a healthcare provider or local or state health department says they can return to normal activities.  Stay home except to get medical care You should restrict activities outside your home, except for getting medical care. Do not go to work, school, or public areas, and do not use public transportation or taxis.  Call ahead before visiting your doctor Before your medical appointment, call the healthcare provider and tell them that you have, or are being evaluated for, COVID-19 infection. This will help the healthcare providers office take steps to keep other people from getting infected. Ask your healthcare provider to call the local or state health department.  Monitor your symptoms Seek prompt medical attention if your illness is worsening (e.g., difficulty breathing). Before going to your medical appointment, call the healthcare provider and tell them that you have, or are being evaluated for, COVID-19 infection. Ask your healthcare provider to call the local or state health department.  Wear a facemask You should wear a facemask that covers your nose and mouth when you are in the same room with other people and when you visit a healthcare provider. People who live with or visit you should also wear a facemask while they are in the same room with you.  Separate yourself from other people in your home As much as possible, you should stay in a different room from other people in your home. Also, you should use a separate bathroom, if available.  Avoid sharing household items You  should not share dishes, drinking glasses, cups, eating utensils, towels, bedding, or other items with other people in your home. After using these items, you should wash them thoroughly with soap and water.  Cover your coughs and sneezes Cover your mouth and nose with a tissue when you cough or sneeze, or you can cough or sneeze into your sleeve. Throw used tissues in a lined trash can, and immediately wash your hands with soap and water for at least 20 seconds or use an alcohol-based hand rub.  Wash your [employer] your hands often and thoroughly with soap and water for at least 20 seconds. You can use an alcohol-based hand sanitizer if soap and water are not available and if your hands are not visibly dirty. Avoid touching your eyes, nose, and mouth with unwashed hands.   Prevention Steps for Caregivers and Household Members of Individuals Confirmed to have, or Being Evaluated for, COVID-19 Infection Being Cared for in the Home  If you live with, or provide care at home for, a person confirmed to have, or being evaluated for, COVID-19 infection please follow these guidelines to prevent infection:  Follow healthcare providers instructions Make sure that you understand and can help the patient follow any healthcare provider instructions for all care.  Provide for the patients basic needs You should help the patient with basic needs in the home and provide support for getting groceries, prescriptions, and other personal needs.  Monitor the patients symptoms If they are getting sicker, call his or her medical provider and tell them that the patient has, or is being evaluated for, COVID-19 infection. This will help the  healthcare providers office take steps to keep other people from getting infected. Ask the healthcare provider to call the local or state health department.  Limit the number of people who have contact with the patient If possible, have only one caregiver for the  patient. Other household members should stay in another home or place of residence. If this is not possible, they should stay in another room, or be separated from the patient as much as possible. Use a separate bathroom, if available. Restrict visitors who do not have an essential need to be in the home.  Keep older adults, very young children, and other sick people away from the patient Keep older adults, very young children, and those who have compromised immune systems or chronic health conditions away from the patient. This includes people with chronic heart, lung, or kidney conditions, diabetes, and cancer.  Ensure good ventilation Make sure that shared spaces in the home have good air flow, such as from an air conditioner or an opened window, weather permitting.  Wash your hands often Wash your hands often and thoroughly with soap and water for at least 20 seconds. You can use an alcohol based hand sanitizer if soap and water are not available and if your hands are not visibly dirty. Avoid touching your eyes, nose, and mouth with unwashed hands. Use disposable paper towels to dry your hands. If not available, use dedicated cloth towels and replace them when they become wet.  Wear a facemask and gloves Wear a disposable facemask at all times in the room and gloves when you touch or have contact with the patients blood, body fluids, and/or secretions or excretions, such as sweat, saliva, sputum, nasal mucus, vomit, urine, or feces.  Ensure the mask fits over your nose and mouth tightly, and do not touch it during use. Throw out disposable facemasks and gloves after using them. Do not reuse. Wash your hands immediately after removing your facemask and gloves. If your personal clothing becomes contaminated, carefully remove clothing and launder. Wash your hands after handling contaminated clothing. Place all used disposable facemasks, gloves, and other waste in a lined container before  disposing them with other household waste. Remove gloves and wash your hands immediately after handling these items.  Do not share dishes, glasses, or other household items with the patient Avoid sharing household items. You should not share dishes, drinking glasses, cups, eating utensils, towels, bedding, or other items with a patient who is confirmed to have, or being evaluated for, COVID-19 infection. After the person uses these items, you should wash them thoroughly with soap and water.  Wash laundry thoroughly Immediately remove and wash clothes or bedding that have blood, body fluids, and/or secretions or excretions, such as sweat, saliva, sputum, nasal mucus, vomit, urine, or feces, on them. Wear gloves when handling laundry from the patient. Read and follow directions on labels of laundry or clothing items and detergent. In general, wash and dry with the warmest temperatures recommended on the label.  Clean all areas the individual has used often Clean all touchable surfaces, such as counters, tabletops, doorknobs, bathroom fixtures, toilets, phones, keyboards, tablets, and bedside tables, every day. Also, clean any surfaces that may have blood, body fluids, and/or secretions or excretions on them. Wear gloves when cleaning surfaces the patient has come in contact with. Use a diluted bleach solution (e.g., dilute bleach with 1 part bleach and 10 parts water) or a household disinfectant with a label that says EPA-registered for coronaviruses. To  make a bleach solution at home, add 1 tablespoon of bleach to 1 quart (4 cups) of water. For a larger supply, add  cup of bleach to 1 gallon (16 cups) of water. Read labels of cleaning products and follow recommendations provided on product labels. Labels contain instructions for safe and effective use of the cleaning product including precautions you should take when applying the product, such as wearing gloves or eye protection and making sure you  have good ventilation during use of the product. Remove gloves and wash hands immediately after cleaning.  Monitor yourself for signs and symptoms of illness Caregivers and household members are considered close contacts, should monitor their health, and will be asked to limit movement outside of the home to the extent possible. Follow the monitoring steps for close contacts listed on the symptom monitoring form.   ? If you have additional questions, contact your local health department or call the epidemiologist on call at [phone] (available 24/7). ? This guidance is subject to change. For the most up-to-date guidance from [facility], please refer to their website: YouBlogs.pl

## 2019-03-23 ENCOUNTER — Encounter (HOSPITAL_COMMUNITY): Payer: Self-pay

## 2019-03-23 LAB — NOVEL CORONAVIRUS, NAA (HOSP ORDER, SEND-OUT TO REF LAB; TAT 18-24 HRS): SARS-CoV-2, NAA: NOT DETECTED

## 2019-06-09 ENCOUNTER — Encounter: Payer: Self-pay | Admitting: Emergency Medicine

## 2019-06-09 ENCOUNTER — Ambulatory Visit
Admission: EM | Admit: 2019-06-09 | Discharge: 2019-06-09 | Disposition: A | Discharge status: Home / Self Care | Payer: HRSA Program | Attending: Emergency Medicine | Admitting: Emergency Medicine

## 2019-06-09 ENCOUNTER — Other Ambulatory Visit: Payer: Self-pay

## 2019-06-09 DIAGNOSIS — J069 Acute upper respiratory infection, unspecified: Secondary | ICD-10-CM

## 2019-06-09 DIAGNOSIS — Z20828 Contact with and (suspected) exposure to other viral communicable diseases: Secondary | ICD-10-CM | POA: Diagnosis not present

## 2019-06-09 DIAGNOSIS — R05 Cough: Secondary | ICD-10-CM | POA: Diagnosis not present

## 2019-06-09 DIAGNOSIS — R059 Cough, unspecified: Secondary | ICD-10-CM

## 2019-06-09 HISTORY — DX: Scoliosis, unspecified: M41.9

## 2019-06-09 NOTE — ED Triage Notes (Signed)
Pt presents to [facility] for assessment of cough starting 2 days ago, woke up yesterday with body aches, chills, cough, nasal congestion.  Denies sore throat, denies n/v/d.  Works for [employer] and states two children went home with fevers this week, but denies known exposure.

## 2019-06-09 NOTE — Discharge Instructions (Signed)
Your COVID test is pending - it is important to quarantine / isolate at home until your results are back. °If you test positive and would like further evaluation for persistent or worsening symptoms, you may schedule an E-visit or virtual (video) visit throughout the [facility] MyChart app or website. ° °PLEASE NOTE: If you develop severe chest pain or shortness of breath please go to the ER or call 9-1-1 for further evaluation --> DO NOT schedule electronic or virtual visits for this. °Please call our office for further guidance / recommendations as needed. °

## 2019-06-09 NOTE — ED Provider Notes (Signed)
[facility]    CSN: [id number] Arrival date & time: 06/09/19  1031      History   Chief Complaint Chief Complaint  Patient presents with  . Flu-Like Symptoms    HPI Earl Kim is a 28 y.o. male presenting for 2-day course of myalgias, chills, dry cough, nasal congestion.  No fever, sore throat, change in smell/taste, chest pain, shortness of breath.  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  Patient is not take anything for symptoms.   Past Medical History:  Diagnosis Date  . Scoliosis     There are no problems to display for this patient.   History reviewed. No pertinent surgical history.     Home Medications    Prior to Admission medications   Medication Sig Start Date End Date Taking? Authorizing Provider  ibuprofen (ADVIL,MOTRIN) 200 MG tablet Take 800 mg every 6 (six) hours as needed by mouth for moderate pain.    [provider]    Family History Family History  Problem Relation Age of Onset  . Hypertension Mother     Social History Social History   Tobacco Use  . Smoking status: Current Every Day Smoker    Packs/day: 0.10  . Smokeless tobacco: Never Used  Substance Use Topics  . Alcohol use: No    Alcohol/week: 2.0 standard drinks    Types: 2 Shots of liquor per week    Comment: on weekends  . Drug use: No     Allergies   Patient has no known allergies.   Review of Systems Review of Systems  Constitutional: Positive for chills. Negative for activity change, appetite change, fatigue and fever.  HENT: Positive for congestion. Negative for dental problem, ear pain, facial swelling, hearing loss, sinus pain, sore throat, trouble swallowing and voice change.   Eyes: Negative for photophobia, pain and visual disturbance.  Respiratory: Positive for cough. Negative for shortness of breath, wheezing and stridor.   Cardiovascular:  Negative for chest pain and palpitations.  Gastrointestinal: Negative for diarrhea and vomiting.  Musculoskeletal: Positive for myalgias. Negative for arthralgias, neck pain and neck stiffness.  Neurological: Negative for dizziness and headaches.     Physical Exam Triage Vital Signs ED Triage Vitals  Enc Vitals Group     BP      Pulse      Resp      Temp      Temp src      SpO2      Weight      Height      Head Circumference      Peak Flow      Pain Score      Pain Loc      Pain Edu?      Excl. in [place]?    No data found.  Updated Vital Signs BP 110/76 (BP Location: Left Arm)   Pulse (!) 103   Temp 99.4 F (37.4 C) (Temporal)   Resp 16   SpO2 98%   Visual Acuity Right Eye Distance:   Left Eye Distance:   Bilateral Distance:    Right Eye Near:   Left Eye Near:    Bilateral Near:     Physical Exam Constitutional:      General: He is not in acute distress.    Appearance: He is normal weight. He is not ill-appearing.  HENT:  Head: Normocephalic and atraumatic.     Mouth/Throat:     Mouth: Mucous membranes are moist.     Pharynx: Oropharynx is clear.  Eyes:     General: No scleral icterus.    Pupils: Pupils are equal, round, and reactive to light.  Cardiovascular:     Rate and Rhythm: Normal rate.  Pulmonary:     Effort: Pulmonary effort is normal. No respiratory distress.     Breath sounds: No wheezing.  Musculoskeletal:     Cervical back: Normal range of motion and neck supple. No rigidity or tenderness.  Lymphadenopathy:     Cervical: No cervical adenopathy.  Skin:    Coloration: Skin is not jaundiced or pale.  Neurological:     Mental Status: He is alert and oriented to person, place, and time.      UC Treatments / Results  Labs (all labs ordered are listed, but only abnormal results are displayed) Labs Reviewed  NOVEL CORONAVIRUS, NAA    EKG   Radiology No results found.  Procedures Procedures (including critical care time)   Medications Ordered in UC Medications - No data to display  Initial Impression / Assessment and Plan / UC Course  I have reviewed the triage vital signs and the nursing notes.  Pertinent labs & imaging results that were available during my care of the patient were reviewed by me and considered in my medical decision making (see chart for details).     Patient afebrile, nontoxic in office today.  Covid PCR pending: Patient quarantine until results are back.  Work note provided.  Patient to use OTC cold medications for symptom management.  Return precautions discussed, patient verbalized understanding and is agreeable to plan. Final Clinical Impressions(s) / UC Diagnoses   Final diagnoses:  Cough  URI with cough and congestion     Discharge Instructions     Your COVID test is pending - it is important to quarantine / isolate at home until your results are back. If you test positive and would like further evaluation for persistent or worsening symptoms, you may schedule an E-visit or virtual (video) visit throughout the [facility] app or website.  PLEASE NOTE: If you develop severe chest pain or shortness of breath please go to the ER or call [id number] for further evaluation --> DO NOT schedule electronic or virtual visits for this. Please call our office for further guidance / recommendations as needed.    ED Prescriptions    None     PDMP not reviewed this encounter.   Hall[place]Potvin, [place], [place] 06/09/19 1320

## 2019-06-09 NOTE — ED Notes (Signed)
Patient able to ambulate independently  

## 2019-06-11 LAB — NOVEL CORONAVIRUS, NAA: SARS-CoV-2, NAA: DETECTED — AB

## 2019-06-13 ENCOUNTER — Telehealth: Payer: Self-pay | Admitting: Emergency Medicine

## 2019-06-13 NOTE — Telephone Encounter (Signed)
Your test for COVID-19 was positive, meaning that you were infected with the novel coronavirus and could give the germ to others.  Please continue isolation at home for at least 10 days since the start of your symptoms. If you do not have symptoms, please isolate at home for 10 days from the day you were tested. Once you complete your 10 day quarantine, you may return to normal activities as long as you've not had a fever for over 24 hours(without taking fever reducing medicine) and your symptoms are improving. Please continue good preventive care measures, including:  frequent hand-washing, avoid touching your face, cover coughs/sneezes, stay out of crowds and keep a 6 foot distance from others.  Go to the nearest hospital emergency room if fever/cough/breathlessness are severe or illness seems like a threat to life.  Patient contacted by phone and made aware of    results. Pt verbalized understanding and had all questions answered.  Quarantine ends Dec 20th.

## 2020-01-11 ENCOUNTER — Other Ambulatory Visit: Payer: Self-pay

## 2020-01-11 ENCOUNTER — Ambulatory Visit
Admission: EM | Admit: 2020-01-11 | Discharge: 2020-01-11 | Disposition: A | Discharge status: Home / Self Care | Payer: Medicaid Other | Attending: Physician Assistant | Admitting: Physician Assistant

## 2020-01-11 DIAGNOSIS — H01024 Squamous blepharitis left upper eyelid: Secondary | ICD-10-CM

## 2020-01-11 DIAGNOSIS — H00014 Hordeolum externum left upper eyelid: Secondary | ICD-10-CM

## 2020-01-11 MED ORDER — ERYTHROMYCIN 5 MG/GM OP OINT: TOPICAL_OINTMENT | OPHTHALMIC | 0 refills | Status: DC

## 2020-01-11 NOTE — ED Triage Notes (Signed)
Pt c/o stye to lt upper eye lid x4 days, now having swelling under lt eye. Denies vision changes or drainage.

## 2020-01-11 NOTE — Discharge Instructions (Addendum)
Lid scrubs and warm compresses as directed. If having increased redness, discharge to the eye, can fill erythromycin ointment and use as directed. Monitor for any worsening of symptoms, changes in vision, sensitivity to light, eye swelling, painful eye movement, follow up with ophthalmology for further evaluation.

## 2020-01-11 NOTE — ED Provider Notes (Signed)
[facility]    CSN: [id number] Arrival date & time: 01/11/20  1150      History   Chief Complaint Chief Complaint  Patient presents with   Eye Pain    HPI Earl Kim is a 29 y.o. male.   29 year old male comes in for left upper eyelid swelling for the last 4 days with worsening symptoms. Had itching to the eye prior to symptom onset. Has frequent tearing without drainage/significant crusting. Denies eye redness, vision changes, photophobia. Denies contact lens, glasses use.      Past Medical History:  Diagnosis Date   Scoliosis     There are no problems to display for this patient.   History reviewed. No pertinent surgical history.     Home Medications    Prior to Admission medications   Medication Sig Start Date End Date Taking? Authorizing Provider  erythromycin ophthalmic ointment Place a 1/2 inch ribbon of ointment to left upper eyelid 4 times a day for 7 days 01/11/20   Belinda Fisher, PA-C    Family History Family History  Problem Relation Age of Onset   Hypertension Mother     Social History Social History   Tobacco Use   Smoking status: Current Every Day Smoker    Packs/day: 0.10   Smokeless tobacco: Never Used  Substance Use Topics   Alcohol use: Yes    Alcohol/week: 2.0 standard drinks    Types: 2 Shots of liquor per week    Comment: on weekends   Drug use: No     Allergies   Patient has no known allergies.   Review of Systems Review of Systems  Reason unable to perform ROS: See HPI as above.     Physical Exam Triage Vital Signs ED Triage Vitals  Enc Vitals Group     BP 01/11/20 1206 115/76     Pulse Rate 01/11/20 1206 87     Resp 01/11/20 1206 16     Temp 01/11/20 1206 98 F (36.7 C)     Temp Source 01/11/20 1206 Oral     SpO2 01/11/20 1206 99 %     Weight --      Height --      Head Circumference --      Peak Flow --      Pain Score 01/11/20 1211 0     Pain Loc --      Pain Edu? --       Excl. in [place]? --    No data found.  Updated Vital Signs BP 115/76 (BP Location: Right Arm)    Pulse 87    Temp 98 F (36.7 C) (Oral)    Resp 16    SpO2 99%   Visual Acuity Right Eye Distance: 20/30 Left Eye Distance: 20/40 Bilateral Distance: 20/20  Right Eye Near:   Left Eye Near:    Bilateral Near:     Physical Exam Constitutional:      General: He is not in acute distress.    Appearance: He is well-developed.  HENT:     Head: Normocephalic and atraumatic.  Eyes:     Pupils: Pupils are equal, round, and reactive to light.     Comments: Hordeolum to left temporal upper eyelid. EOMI. Conjunctiva clear.   Musculoskeletal:     Cervical back: Normal range of motion and neck supple.  Skin:    General: Skin is warm and dry.  Neurological:     Mental Status:  He is alert and oriented to person, place, and time.      UC Treatments / Results  Labs (all labs ordered are listed, but only abnormal results are displayed) Labs Reviewed - No data to display  EKG   Radiology No results found.  Procedures Procedures (including critical care time)  Medications Ordered in UC Medications - No data to display  Initial Impression / Assessment and Plan / UC Course  I have reviewed the triage vital signs and the nursing notes.  Pertinent labs & imaging results that were available during my care of the patient were reviewed by me and considered in my medical decision making (see chart for details).    Lid scrubs and warm compresses as directed. Rx of erythromycin ointment called into pharmacy, can start if having increased crusting/worsening symptoms. Patient to follow up with ophthalmology if symptoms worsens or does not improve. Return precautions given.    Final Clinical Impressions(s) / UC Diagnoses   Final diagnoses:  Squamous blepharitis of left upper eyelid  Hordeolum externum of left upper eyelid   ED Prescriptions    Medication Sig Dispense Auth. Provider    erythromycin ophthalmic ointment Place a 1/2 inch ribbon of ointment to left upper eyelid 4 times a day for 7 days 1 g Belinda Fisher, PA-C     PDMP not reviewed this encounter.   Belinda Fisher, PA-C 01/11/20 1252

## 2022-01-01 ENCOUNTER — Ambulatory Visit
Admission: EM | Admit: 2022-01-01 | Discharge: 2022-01-01 | Disposition: A | Discharge status: Home / Self Care | Payer: Medicaid Other | Attending: Internal Medicine | Admitting: Internal Medicine

## 2022-01-01 DIAGNOSIS — Z113 Encounter for screening for infections with a predominantly sexual mode of transmission: Secondary | ICD-10-CM | POA: Insufficient documentation

## 2022-01-01 DIAGNOSIS — R369 Urethral discharge, unspecified: Secondary | ICD-10-CM | POA: Insufficient documentation
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
## 2022-01-01 NOTE — Discharge Instructions (Signed)
Your STD test is pending.  We will call if results are positive and treat as appropriate.  Please refrain from sexual activity until test results and treatment are complete.

## 2022-01-01 NOTE — ED Triage Notes (Signed)
Pt present  penis discharge with itching. Symptom started four days ago.

## 2022-01-02 ENCOUNTER — Telehealth (HOSPITAL_COMMUNITY): Payer: Self-pay | Admitting: Emergency Medicine

## 2022-01-02 LAB — CYTOLOGY, (ORAL, ANAL, URETHRAL) ANCILLARY ONLY
Chlamydia: NEGATIVE
Comment: NEGATIVE
Comment: NEGATIVE
Comment: NORMAL
Neisseria Gonorrhea: NEGATIVE
Trichomonas: POSITIVE — AB

## 2022-01-02 MED ORDER — METRONIDAZOLE 500 MG PO TABS: 2000.0000 mg | ORAL_TABLET | Freq: Once | ORAL | 0 refills | Status: AC

## 2022-05-04 ENCOUNTER — Encounter: Payer: Self-pay | Admitting: Emergency Medicine

## 2022-05-04 ENCOUNTER — Ambulatory Visit
Admission: EM | Admit: 2022-05-04 | Discharge: 2022-05-04 | Disposition: A | Discharge status: Home / Self Care | Payer: Self-pay | Attending: Internal Medicine | Admitting: Internal Medicine

## 2022-05-04 DIAGNOSIS — K6289 Other specified diseases of anus and rectum: Secondary | ICD-10-CM

## 2022-05-04 MED ORDER — HYDROCORTISONE ACETATE 25 MG RE SUPP: 25.0000 mg | Freq: Two times a day (BID) | RECTAL | 0 refills | Status: DC

## 2022-05-04 NOTE — ED Provider Notes (Signed)
[facility]    CSN: [id number] Arrival date & time: 05/04/22  1227      History   Chief Complaint Chief Complaint  Patient presents with   rectum pain    HPI Earl Kim is a 31 y.o. male.   Patient presents with rectal pain that started about 3 days ago.  Patient is concerned for hemorrhoids but denies history of hemorrhoids.  Patient denies that he has had any constipation, blood in stool, diarrhea.  Reports he is having normal bowel movements.  Denies any nausea or vomiting.  Patient reports that he does have sexual intercourse with males but denies any penile penetration at all.     Past Medical History:  Diagnosis Date   Scoliosis     There are no problems to display for this patient.   History reviewed. No pertinent surgical history.     Home Medications    Prior to Admission medications   Medication Sig Start Date End Date Taking? Authorizing Provider  hydrocortisone (ANUSOL-HC) 25 MG suppository Place 1 suppository (25 mg total) rectally 2 (two) times daily. 05/04/22  Yes Gustavus Bryant, FNP  erythromycin ophthalmic ointment Place a 1/2 inch ribbon of ointment to left upper eyelid 4 times a day for 7 days 01/11/20   Belinda Fisher, PA-C    Family History Family History  Problem Relation Age of Onset   Hypertension Mother     Social History Social History   Tobacco Use   Smoking status: Every Day    Packs/day: 0.10    Types: Cigarettes   Smokeless tobacco: Never  Substance Use Topics   Alcohol use: Yes    Alcohol/week: 2.0 standard drinks of alcohol    Types: 2 Shots of liquor per week    Comment: on weekends   Drug use: No     Allergies   Patient has no known allergies.   Review of Systems Review of Systems Per HPI  Physical Exam Triage Vital Signs ED Triage Vitals [05/04/22 1339]  Enc Vitals Group     BP 123/85     Pulse Rate 77     Resp 18     Temp 97.7 F (36.5 C)     Temp src      SpO2 99 %     Weight       Height      Head Circumference      Peak Flow      Pain Score 8     Pain Loc      Pain Edu?      Excl. in [place]?    No data found.  Updated Vital Signs BP 123/85   Pulse 77   Temp 97.7 F (36.5 C)   Resp 18   SpO2 99%   Visual Acuity Right Eye Distance:   Left Eye Distance:   Bilateral Distance:    Right Eye Near:   Left Eye Near:    Bilateral Near:     Physical Exam Exam conducted with a chaperone present.  Constitutional:      General: He is not in acute distress.    Appearance: Normal appearance. He is not toxic-appearing or diaphoretic.  HENT:     Head: Normocephalic and atraumatic.  Eyes:     Extraocular Movements: Extraocular movements intact.     Conjunctiva/sclera: Conjunctivae normal.  Pulmonary:     Effort: Pulmonary effort is normal.  Genitourinary:    Rectum: No external hemorrhoid or  internal hemorrhoid.     Comments: There are no obvious abnormalities including abscess, anal fissure, internal/external hemorrhoid noted.  No bleeding noted. Neurological:     General: No focal deficit present.     Mental Status: He is alert and oriented to person, place, and time. Mental status is at baseline.  Psychiatric:        Mood and Affect: Mood normal.        Behavior: Behavior normal.        Thought Content: Thought content normal.        Judgment: Judgment normal.      UC Treatments / Results  Labs (all labs ordered are listed, but only abnormal results are displayed) Labs Reviewed - No data to display  EKG   Radiology No results found.  Procedures Procedures (including critical care time)  Medications Ordered in UC Medications - No data to display  Initial Impression / Assessment and Plan / UC Course  I have reviewed the triage vital signs and the nursing notes.  Pertinent labs & imaging results that were available during my care of the patient were reviewed by me and considered in my medical decision making (see chart for details).      Unsure exact etiology of patient's rectal pain but I am suspicious of possible internal hemorrhoid despite physical exam.  Will treat with Anusol suppositories.  Patient denies that he has had any penile penetration in the rectum area so STD testing was deferred.  There are no obvious anal fissures or other abnormalities noted either.  Patient was advised to follow-up if symptoms persist or worsen.  Patient verbalized understanding and was agreeable with plan. Final Clinical Impressions(s) / UC Diagnoses   Final diagnoses:  Rectal pain     Discharge Instructions      I have prescribed you a suppository medications to alleviate symptoms.  Please follow-up if symptoms persist or worsen.    ED Prescriptions     Medication Sig Dispense Auth. Provider   hydrocortisone (ANUSOL-HC) 25 MG suppository Place 1 suppository (25 mg total) rectally 2 (two) times daily. 12 suppository [place], Michele Rockers, [place]      PDMP not reviewed this encounter.   Teodora Medici, [place] 05/04/22 1422

## 2022-05-04 NOTE — Discharge Instructions (Signed)
I have prescribed you a suppository medications to alleviate symptoms.  Please follow-up if symptoms persist or worsen.

## 2022-05-04 NOTE — ED Triage Notes (Signed)
Pt is present today with c/o rectum pain x 3 days ago. Pt denies any rectum bleeding.

## 2022-06-13 ENCOUNTER — Ambulatory Visit
Admission: EM | Admit: 2022-06-13 | Discharge: 2022-06-13 | Disposition: A | Discharge status: Home / Self Care | Payer: Commercial Managed Care - HMO | Attending: Internal Medicine | Admitting: Internal Medicine

## 2022-06-13 ENCOUNTER — Encounter: Payer: Self-pay | Admitting: Emergency Medicine

## 2022-06-13 DIAGNOSIS — J069 Acute upper respiratory infection, unspecified: Secondary | ICD-10-CM | POA: Diagnosis not present

## 2022-06-13 MED ORDER — CETIRIZINE HCL 10 MG PO TABS: 10.0000 mg | ORAL_TABLET | Freq: Every day | ORAL | 0 refills | Status: AC

## 2022-06-13 MED ORDER — FLUTICASONE PROPIONATE 50 MCG/ACT NA SUSP: 1.0000 | Freq: Every day | NASAL | 0 refills | Status: DC

## 2022-06-13 MED ORDER — BENZONATATE 100 MG PO CAPS: 100.0000 mg | ORAL_CAPSULE | Freq: Three times a day (TID) | ORAL | 0 refills | Status: DC | PRN

## 2022-06-13 NOTE — ED Provider Notes (Addendum)
[facility]    CSN: [id number] Arrival date & time: 06/13/22  0850      History   Chief Complaint Chief Complaint  Patient presents with   Cough    HPI Earl Kim is a 31 y.o. male.   Patient presents with nonproductive cough, sinus pressure, nasal congestion that started yesterday.  Denies any known sick contacts or fever at home. Patient denies chest pain, shortness of breath, sore throat, ear pain, nausea, vomiting, diarrhea, abdominal pain.  Patient has not taken any medications to help alleviate symptoms.  Patient denies history of asthma but does smoke cigarettes.   Cough   Past Medical History:  Diagnosis Date   Scoliosis     There are no problems to display for this patient.   History reviewed. No pertinent surgical history.     Home Medications    Prior to Admission medications   Medication Sig Start Date End Date Taking? Authorizing Provider  benzonatate (TESSALON) 100 MG capsule Take 1 capsule (100 mg total) by mouth every 8 (eight) hours as needed for cough. 06/13/22  Yes Jeric Slagel, Acie Fredrickson, FNP  cetirizine (ZYRTEC) 10 MG tablet Take 1 tablet (10 mg total) by mouth daily. 06/13/22  Yes Iann Rodier, Rolly Salter E, FNP  fluticasone (FLONASE) 50 MCG/ACT nasal spray Place 1 spray into both nostrils daily. 06/13/22  Yes Gustavus Bryant, FNP  erythromycin ophthalmic ointment Place a 1/2 inch ribbon of ointment to left upper eyelid 4 times a day for 7 days 01/11/20   Belinda Fisher, PA-C  hydrocortisone (ANUSOL-HC) 25 MG suppository Place 1 suppository (25 mg total) rectally 2 (two) times daily. 05/04/22   Gustavus Bryant, FNP    Family History Family History  Problem Relation Age of Onset   Hypertension Mother     Social History Social History   Tobacco Use   Smoking status: Every Day    Packs/day: 0.10    Types: Cigarettes   Smokeless tobacco: Never  Substance Use Topics   Alcohol use: Yes    Alcohol/week: 2.0 standard drinks of alcohol    Types:  2 Shots of liquor per week    Comment: on weekends   Drug use: No     Allergies   Patient has no known allergies.   Review of Systems Review of Systems Per HPI  Physical Exam Triage Vital Signs ED Triage Vitals  Enc Vitals Group     BP 06/13/22 0935 115/78     Pulse Rate 06/13/22 0935 62     Resp 06/13/22 0935 18     Temp 06/13/22 0935 98.1 F (36.7 C)     Temp src --      SpO2 06/13/22 0935 98 %     Weight --      Height --      Head Circumference --      Peak Flow --      Pain Score 06/13/22 0934 0     Pain Loc --      Pain Edu? --      Excl. in GC? --    No data found.  Updated Vital Signs BP 115/78   Pulse 62   Temp 98.1 F (36.7 C)   Resp 18   SpO2 98%   Visual Acuity Right Eye Distance:   Left Eye Distance:   Bilateral Distance:    Right Eye Near:   Left Eye Near:    Bilateral Near:     Physical  Exam Constitutional:      General: He is not in acute distress.    Appearance: Normal appearance. He is not toxic-appearing or diaphoretic.  HENT:     Head: Normocephalic and atraumatic.     Right Ear: Tympanic membrane and ear canal normal.     Left Ear: Tympanic membrane and ear canal normal.     Nose: Congestion present.     Mouth/Throat:     Mouth: Mucous membranes are moist.     Pharynx: No posterior oropharyngeal erythema.  Eyes:     Extraocular Movements: Extraocular movements intact.     Conjunctiva/sclera: Conjunctivae normal.     Pupils: Pupils are equal, round, and reactive to light.  Cardiovascular:     Rate and Rhythm: Normal rate and regular rhythm.     Pulses: Normal pulses.     Heart sounds: Normal heart sounds.  Pulmonary:     Effort: Pulmonary effort is normal. No respiratory distress.     Breath sounds: Normal breath sounds. No stridor. No wheezing, rhonchi or rales.  Abdominal:     General: Abdomen is flat. Bowel sounds are normal.     Palpations: Abdomen is soft.  Musculoskeletal:        General: Normal range of motion.      Cervical back: Normal range of motion.  Skin:    General: Skin is warm and dry.  Neurological:     General: No focal deficit present.     Mental Status: He is alert and oriented to person, place, and time. Mental status is at baseline.  Psychiatric:        Mood and Affect: Mood normal.        Behavior: Behavior normal.      UC Treatments / Results  Labs (all labs ordered are listed, but only abnormal results are displayed) Labs Reviewed - No data to display  EKG   Radiology No results found.  Procedures Procedures (including critical care time)  Medications Ordered in UC Medications - No data to display  Initial Impression / Assessment and Plan / UC Course  I have reviewed the triage vital signs and the nursing notes.  Pertinent labs & imaging results that were available during my care of the patient were reviewed by me and considered in my medical decision making (see chart for details).     Patient presents with symptoms likely from a viral upper respiratory infection. Differential includes bacterial pneumonia, sinusitis, allergic rhinitis, COVID-19, flu. Do not suspect underlying cardiopulmonary process. Symptoms seem unlikely related to ACS, CHF or COPD exacerbations, pneumonia, pneumothorax. Patient is nontoxic appearing and not in need of emergent medical intervention.  Patient declined viral testing.  Recommended symptom control with over the counter medications: Daily oral anti-histamine, Oral decongestant or IN corticosteroid, saline irrigations, cepacol lozenges, Robitussin, Delsym, honey tea.  Patient sent prescriptions.  Return if symptoms fail to improve in 1-2 weeks or you develop shortness of breath, chest pain, severe headache. Patient states understanding and is agreeable.  Discharged with PCP followup.  Final Clinical Impressions(s) / UC Diagnoses   Final diagnoses:  Viral upper respiratory tract infection with cough     Discharge Instructions       You have a viral upper respiratory infection which should run its course and self resolve with symptomatic treatment as we discussed.  I have sent you 3 medications to help alleviate symptoms.  Please follow-up if symptoms persist or worsen.    ED Prescriptions  Medication Sig Dispense Auth. Provider   fluticasone (FLONASE) 50 MCG/ACT nasal spray Place 1 spray into both nostrils daily. 16 g Leira Regino, Rolly Salter E, [place]   benzonatate (TESSALON) 100 MG capsule Take 1 capsule (100 mg total) by mouth every 8 (eight) hours as needed for cough. 21 capsule [place], Knobel E, [place]   cetirizine (ZYRTEC) 10 MG tablet Take 1 tablet (10 mg total) by mouth daily. 30 tablet [place], Acie Fredrickson, [place]      PDMP not reviewed this encounter.   Gustavus Bryant, [place] 06/13/22 1015    Gustavus Bryant, [place] 06/13/22 1015

## 2022-06-13 NOTE — Discharge Instructions (Signed)
You have a viral upper respiratory infection which should run its course and self resolve with symptomatic treatment as we discussed.  I have sent you 3 medications to help alleviate symptoms.  Please follow-up if symptoms persist or worsen. 

## 2022-06-13 NOTE — ED Triage Notes (Signed)
Pt is present today with c/o cough and sinus pressure.  Onset ~yesterday

## 2022-11-13 ENCOUNTER — Ambulatory Visit
Admission: EM | Admit: 2022-11-13 | Discharge: 2022-11-13 | Disposition: A | Discharge status: Home / Self Care | Payer: BLUE CROSS/BLUE SHIELD | Attending: Family Medicine | Admitting: Family Medicine

## 2022-11-13 DIAGNOSIS — M549 Dorsalgia, unspecified: Secondary | ICD-10-CM | POA: Diagnosis not present

## 2022-11-13 MED ORDER — TIZANIDINE HCL 4 MG PO TABS: 4.0000 mg | ORAL_TABLET | Freq: Three times a day (TID) | ORAL | 0 refills | Status: DC | PRN

## 2022-11-13 MED ORDER — KETOROLAC TROMETHAMINE 10 MG PO TABS: 10.0000 mg | ORAL_TABLET | Freq: Four times a day (QID) | ORAL | 0 refills | Status: DC | PRN

## 2022-11-13 MED ORDER — KETOROLAC TROMETHAMINE 30 MG/ML IJ SOLN
30.0000 mg | Freq: Once | INTRAMUSCULAR | Status: AC
  Administered 2022-11-13: 30 mg via INTRAMUSCULAR

## 2022-11-13 NOTE — Discharge Instructions (Addendum)
You have been given a shot of Toradol 30 mg today.  Ketorolac 10 mg tablets--take 1 tablet every 6 hours as needed for pain.  This is the same medicine that is in the shot we just gave you   Take tizanidine 4 mg--1 every 8 hours as needed for muscle spasms; this medication can cause dizziness and sleepiness

## 2022-11-13 NOTE — ED Provider Notes (Signed)
[facility]    CSN: [id number] Arrival date & time: 11/13/22  6213      History   Chief Complaint Chief Complaint  Patient presents with   Back Pain    HPI Earl Kim is a 32 y.o. male.    Back Pain  Here for left mid back pain.  He had pushed someone very heavy up a ramp in a wheelchair on May 14.  He immediately had some palpitations and felt sweaty.  Then his left mid back started hurting him inferior to his shoulder blade.  It hurts worse to bend and get up and to twist the torso.  It also hurts worse to breathe deeply.  No cough or congestion or fever.  No rash.  No dysuria or hematuria. Past Medical History:  Diagnosis Date   Scoliosis     There are no problems to display for this patient.   History reviewed. No pertinent surgical history.     Home Medications    Prior to Admission medications   Medication Sig Start Date End Date Taking? Authorizing Provider  ketorolac (TORADOL) 10 MG tablet Take 1 tablet (10 mg total) by mouth every 6 (six) hours as needed (pain). 11/13/22  Yes Makaria Poarch, Janace Aris, MD  tiZANidine (ZANAFLEX) 4 MG tablet Take 1 tablet (4 mg total) by mouth every 8 (eight) hours as needed for muscle spasms. 11/13/22  Yes Zenia Resides, MD  cetirizine (ZYRTEC) 10 MG tablet Take 1 tablet (10 mg total) by mouth daily. 06/13/22   Gustavus Bryant, FNP    Family History Family History  Problem Relation Age of Onset   Hypertension Mother     Social History Social History   Tobacco Use   Smoking status: Every Day    Packs/day: .1    Types: Cigarettes   Smokeless tobacco: Never  Substance Use Topics   Alcohol use: Yes    Alcohol/week: 2.0 standard drinks of alcohol    Types: 2 Shots of liquor per week    Comment: on weekends   Drug use: No     Allergies   Patient has no known allergies.   Review of Systems Review of Systems  Musculoskeletal:  Positive for back pain.     Physical Exam Triage Vital  Signs ED Triage Vitals  Enc Vitals Group     BP 11/13/22 0919 125/75     Pulse Rate 11/13/22 0919 66     Resp 11/13/22 0919 16     Temp 11/13/22 0919 98.1 F (36.7 C)     Temp Source 11/13/22 0919 Oral     SpO2 11/13/22 0919 98 %     Weight --      Height --      Head Circumference --      Peak Flow --      Pain Score 11/13/22 0920 9     Pain Loc --      Pain Edu? --      Excl. in GC? --    No data found.  Updated Vital Signs BP 125/75 (BP Location: Left Arm)   Pulse 66   Temp 98.1 F (36.7 C) (Oral)   Resp 16   SpO2 98%   Visual Acuity Right Eye Distance:   Left Eye Distance:   Bilateral Distance:    Right Eye Near:   Left Eye Near:    Bilateral Near:     Physical Exam Vitals reviewed.  Constitutional:  General: He is not in acute distress.    Appearance: He is not toxic-appearing.  HENT:     Nose: Nose normal.     Mouth/Throat:     Mouth: Mucous membranes are moist.     Pharynx: No oropharyngeal exudate or posterior oropharyngeal erythema.  Eyes:     Extraocular Movements: Extraocular movements intact.     Conjunctiva/sclera: Conjunctivae normal.     Pupils: Pupils are equal, round, and reactive to light.  Cardiovascular:     Rate and Rhythm: Normal rate and regular rhythm.     Heart sounds: No murmur heard. Pulmonary:     Effort: No respiratory distress.     Breath sounds: No stridor. No wheezing, rhonchi or rales.     Comments: There is tenderness over the left lateral lower rib cage in the back.  It is in the posterior axillary line and in the line directly inferior to the scapula.  There is no rash or erythema or deformity. Musculoskeletal:     Cervical back: Neck supple.  Lymphadenopathy:     Cervical: No cervical adenopathy.  Skin:    Capillary Refill: Capillary refill takes less than 2 seconds.     Coloration: Skin is not jaundiced or pale.  Neurological:     General: No focal deficit present.     Mental Status: He is alert and oriented  to person, place, and time.  Psychiatric:        Behavior: Behavior normal.      UC Treatments / Results  Labs (all labs ordered are listed, but only abnormal results are displayed) Labs Reviewed - No data to display  EKG   Radiology No results found.  Procedures Procedures (including critical care time)  Medications Ordered in UC Medications  ketorolac (TORADOL) 30 MG/ML injection 30 mg (has no administration in time range)    Initial Impression / Assessment and Plan / UC Course  I have reviewed the triage vital signs and the nursing notes.  Pertinent labs & imaging results that were available during my care of the patient were reviewed by me and considered in my medical decision making (see chart for details).        I do think that most likely this is muscular in nature.  Toradol injection is given here and Toradol tablets are sent in along with tizanidine muscle relaxer.  Since he had some diaphoresis and palpitations and symptoms of exertion when this all started, chest x-ray with rib detail is ordered.  He may go get it today, or he may wait and get it tomorrow if he is not improving with the treatment provided. Final Clinical Impressions(s) / UC Diagnoses   Final diagnoses:  Mid back pain on left side     Discharge Instructions      You have been given a shot of Toradol 30 mg today.  Ketorolac 10 mg tablets--take 1 tablet every 6 hours as needed for pain.  This is the same medicine that is in the shot we just gave you   Take tizanidine 4 mg--1 every 8 hours as needed for muscle spasms; this medication can cause dizziness and sleepiness       ED Prescriptions     Medication Sig Dispense Auth. Provider   ketorolac (TORADOL) 10 MG tablet Take 1 tablet (10 mg total) by mouth every 6 (six) hours as needed (pain). 20 tablet Zenia Resides, MD   tiZANidine (ZANAFLEX) 4 MG tablet Take 1 tablet (4 mg  total) by mouth every 8 (eight) hours as needed  for muscle spasms. 15 tablet Lillard Bailon, Janace Aris, MD      I have reviewed the PDMP during this encounter.   Zenia Resides, MD 11/13/22 [phone]

## 2022-11-13 NOTE — ED Triage Notes (Signed)
Pt states mid back pain for the past 2 days after pushing a heavy person in  a wheelchair. States he has been taking Tylenol and Motrin with no relief.

## 2022-11-19 ENCOUNTER — Ambulatory Visit (HOSPITAL_COMMUNITY)
Admission: EM | Admit: 2022-11-19 | Discharge: 2022-11-19 | Disposition: A | Discharge status: Home / Self Care | Payer: BLUE CROSS/BLUE SHIELD | Attending: Family Medicine | Admitting: Family Medicine

## 2022-11-19 ENCOUNTER — Encounter (HOSPITAL_COMMUNITY): Payer: Self-pay

## 2022-11-19 ENCOUNTER — Ambulatory Visit (INDEPENDENT_AMBULATORY_CARE_PROVIDER_SITE_OTHER): Payer: BLUE CROSS/BLUE SHIELD

## 2022-11-19 DIAGNOSIS — M62830 Muscle spasm of back: Secondary | ICD-10-CM

## 2022-11-19 MED ORDER — KETOROLAC TROMETHAMINE 30 MG/ML IJ SOLN
INTRAMUSCULAR | Status: AC
  Filled 2022-11-19: qty 1

## 2022-11-19 MED ORDER — TIZANIDINE HCL 4 MG PO TABS: 4.0000 mg | ORAL_TABLET | Freq: Three times a day (TID) | ORAL | 0 refills | Status: AC | PRN

## 2022-11-19 MED ORDER — KETOROLAC TROMETHAMINE 30 MG/ML IJ SOLN
30.0000 mg | Freq: Once | INTRAMUSCULAR | Status: AC
  Administered 2022-11-19: 30 mg via INTRAMUSCULAR

## 2022-11-19 MED ORDER — KETOROLAC TROMETHAMINE 10 MG PO TABS: 10.0000 mg | ORAL_TABLET | Freq: Four times a day (QID) | ORAL | 0 refills | Status: AC | PRN

## 2022-11-19 NOTE — ED Provider Notes (Signed)
[facility]    CSN: [id number] Arrival date & time: 11/19/22  1359      History   Chief Complaint Chief Complaint  Patient presents with   Back Pain    HPI [facility] Kim is a 32 y.o. male.    Back Pain  Here for continued bilateral rib and lower thoracic pain.  He was seen by me at one of our other urgent cares on May 16.  We did not have x-ray in the building that day.  He had [place] this pain has begun.  Then he started feeling pain and burning in his left mid back and also some in his right mid back.  He was seen on the 16th the Toradol injection was given and he reports it did help him.  He was unable to get the medications filled due to trouble getting his insurance filed    No fever or cough and no rash.  Past Medical History:  Diagnosis Date   Scoliosis     There are no problems to display for this patient.   History reviewed. No pertinent surgical history.     Home Medications    Prior to Admission medications   Medication Sig Start Date End Date Taking? Authorizing Provider  cetirizine (ZYRTEC) 10 MG tablet Take 1 tablet (10 mg total) by mouth daily. 06/13/22   Gustavus Bryant, FNP  ketorolac (TORADOL) 10 MG tablet Take 1 tablet (10 mg total) by mouth every 6 (six) hours as needed (pain). 11/19/22   Zenia Resides, MD  tiZANidine (ZANAFLEX) 4 MG tablet Take 1 tablet (4 mg total) by mouth every 8 (eight) hours as needed for muscle spasms. 11/19/22   Zenia Resides, MD    Family History Family History  Problem Relation Age of Onset   Hypertension Mother     Social History Social History   Tobacco Use   Smoking status: Every Day    Packs/day: .1    Types: Cigarettes   Smokeless tobacco: Never  Substance Use Topics   Alcohol use: Yes    Alcohol/week: 2.0 standard drinks of alcohol    Types: 2 Shots of liquor per week    Comment: on  weekends   Drug use: No     Allergies   Patient has no known allergies.   Review of Systems Review of Systems  Musculoskeletal:  Positive for back pain.     Physical Exam Triage Vital Signs ED Triage Vitals [11/19/22 1422]  Enc Vitals Group     BP 130/80     Pulse Rate 78     Resp 16     Temp 98.2 F (36.8 C)     Temp Source Oral     SpO2 97 %     Weight      Height      Head Circumference      Peak Flow      Pain Score 9     Pain Loc      Pain Edu?      Excl. in GC?    No data found.  Updated Vital Signs BP 130/80 (BP Location: Right Arm)   Pulse 78   Temp 98.2 F (36.8 C) (Oral)   Resp 16   SpO2 97%   Visual Acuity Right Eye Distance:   Left Eye Distance:   Bilateral Distance:  Right Eye Near:   Left Eye Near:    Bilateral Near:     Physical Exam Vitals reviewed.  Constitutional:      General: He is not in acute distress.    Appearance: He is not toxic-appearing.  HENT:     Mouth/Throat:     Mouth: Mucous membranes are moist.  Eyes:     Extraocular Movements: Extraocular movements intact.     Pupils: Pupils are equal, round, and reactive to light.  Cardiovascular:     Rate and Rhythm: Normal rate and regular rhythm.     Heart sounds: No murmur heard. Pulmonary:     Effort: No respiratory distress.     Breath sounds: No stridor. No wheezing, rhonchi or rales.  Musculoskeletal:     Cervical back: Neck supple.     Comments: There is some tenderness over his lower thoracic areas bilaterally.  Lymphadenopathy:     Cervical: No cervical adenopathy.  Skin:    Capillary Refill: Capillary refill takes less than 2 seconds.     Coloration: Skin is not jaundiced or pale.  Neurological:     General: No focal deficit present.     Mental Status: He is alert and oriented to person, place, and time.  Psychiatric:        Behavior: Behavior normal.      UC Treatments / Results  Labs (all labs ordered are listed, but only abnormal results are  displayed) Labs Reviewed - No data to display  EKG   Radiology DG Chest 2 View  Result Date: 11/19/2022 CLINICAL DATA:  Bilateral lower thoracic and rib pain EXAM: CHEST - 2 VIEW COMPARISON:  08/30/2017 FINDINGS: Frontal and lateral views of the chest demonstrate an unremarkable cardiac silhouette. No airspace disease, effusion, or pneumothorax. No acute displaced fracture. IMPRESSION: 1. No acute intrathoracic process. Electronically Signed   By: Sharlet Salina M.D.   On: 11/19/2022 14:48    Procedures Procedures (including critical care time)  Medications Ordered in UC Medications  ketorolac (TORADOL) 30 MG/ML injection 30 mg (has no administration in time range)    Initial Impression / Assessment and Plan / UC Course  I have reviewed the triage vital signs and the nursing notes.  Pertinent labs & imaging results that were available during my care of the patient were reviewed by me and considered in my medical decision making (see chart for details).    He does have primary care X-ray is benign.  He is given another shot of Toradol and Toradol tablets are sent to the pharmacy.  Muscle relaxer sent in for nighttime use.  He will follow-up with his primary care Final Clinical Impressions(s) / UC Diagnoses   Final diagnoses:  Muscle spasm of back     Discharge Instructions      Your x-ray did not show any bony problems, and there were no problems in the chest.  You have been given a shot of Toradol 30 mg today.  Ketorolac 10 mg tablets--take 1 tablet every 6 hours as needed for pain.  This is the same medicine that is in the shot we just gave you   Take tizanidine 4 mg--1 every 8 hours as needed for muscle spasms; this medication can cause dizziness and sleepiness  Please follow-up with your primary care about this issue     ED Prescriptions     Medication Sig Dispense Auth. Provider   ketorolac (TORADOL) 10 MG tablet Take 1 tablet (10 mg total) by mouth  every 6  (six) hours as needed (pain). 20 tablet Ra Pfiester, Janace Aris, MD   tiZANidine (ZANAFLEX) 4 MG tablet Take 1 tablet (4 mg total) by mouth every 8 (eight) hours as needed for muscle spasms. 15 tablet Quinta Eimer, Janace Aris, MD      PDMP not reviewed this encounter.   Zenia Resides, MD 11/19/22 [phone]

## 2022-11-19 NOTE — Discharge Instructions (Signed)
Your x-ray did not show any bony problems, and there were no problems in the chest.  You have been given a shot of Toradol 30 mg today.  Ketorolac 10 mg tablets--take 1 tablet every 6 hours as needed for pain.  This is the same medicine that is in the shot we just gave you   Take tizanidine 4 mg--1 every 8 hours as needed for muscle spasms; this medication can cause dizziness and sleepiness  Please follow-up with your primary care about this issue

## 2022-11-19 NOTE — ED Triage Notes (Signed)
Pt c/o back pain after pushing a heavy pt in a wheelchair. Reports being seen by an UC already however sxs persist.   Reports was unable to get ketorolac or tizanidine from pharmacy.
